# Patient Record
Sex: Male | Born: 1950 | Hispanic: Yes | Marital: Married | Smoking: Light tobacco smoker
Health system: Southern US, Community
[De-identification: ages and names within clinical notes are randomized; demographics above are authoritative.]

## PROBLEM LIST (undated history)

## (undated) DIAGNOSIS — J309 Allergic rhinitis, unspecified: Secondary | ICD-10-CM

## (undated) DIAGNOSIS — K5792 Diverticulitis of intestine, part unspecified, without perforation or abscess without bleeding: Secondary | ICD-10-CM

## (undated) DIAGNOSIS — Z205 Contact with and (suspected) exposure to viral hepatitis: Secondary | ICD-10-CM

## (undated) DIAGNOSIS — K635 Polyp of colon: Secondary | ICD-10-CM

## (undated) DIAGNOSIS — F419 Anxiety disorder, unspecified: Secondary | ICD-10-CM

## (undated) HISTORY — DX: Anxiety disorder, unspecified: F41.9

## (undated) HISTORY — PX: CIRCUMCISION: SUR203

## (undated) HISTORY — DX: Diverticulitis of intestine, part unspecified, without perforation or abscess without bleeding: K57.92

## (undated) HISTORY — DX: Allergic rhinitis, unspecified: J30.9

## (undated) HISTORY — DX: Polyp of colon: K63.5

## (undated) HISTORY — PX: COLONOSCOPY: SHX174

---

## 2012-09-15 ENCOUNTER — Encounter: Payer: Self-pay | Admitting: Gastroenterology

## 2012-09-30 ENCOUNTER — Ambulatory Visit: Payer: Self-pay | Admitting: Gastroenterology

## 2012-10-24 ENCOUNTER — Ambulatory Visit (INDEPENDENT_AMBULATORY_CARE_PROVIDER_SITE_OTHER): Payer: BC Managed Care – PPO | Admitting: Internal Medicine

## 2012-10-24 ENCOUNTER — Encounter: Payer: Self-pay | Admitting: Internal Medicine

## 2012-10-24 VITALS — BP 124/80 | HR 68 | Ht 68.0 in | Wt 156.0 lb

## 2012-10-24 DIAGNOSIS — R1032 Left lower quadrant pain: Secondary | ICD-10-CM

## 2012-10-24 DIAGNOSIS — K5732 Diverticulitis of large intestine without perforation or abscess without bleeding: Secondary | ICD-10-CM

## 2012-10-24 DIAGNOSIS — K625 Hemorrhage of anus and rectum: Secondary | ICD-10-CM

## 2012-10-24 DIAGNOSIS — R933 Abnormal findings on diagnostic imaging of other parts of digestive tract: Secondary | ICD-10-CM

## 2012-10-24 MED ORDER — MOVIPREP 100 G PO SOLR: 1.0000 | Freq: Once | ORAL | Status: DC

## 2012-10-24 NOTE — Patient Instructions (Addendum)
You have been scheduled for a colonoscopy with propofol. Please follow written instructions given to you at your visit today.  Please pick up your prep kit at the pharmacy within the next 1-3 days. If you use inhalers (even only as needed) or a CPAP machine, please bring them with you on the day of your procedure.  

## 2012-10-24 NOTE — Progress Notes (Signed)
HISTORY OF PRESENT ILLNESS:  Samuel Sanford is a 62 y.o. male with no significant past medical history who presents today regarding problems with diverticulitis and the need for colonoscopy. Patient is accompanied by his friend Mrs. Jarold Motto. Patient states that he was in his usual state of good health until 09/04/2012 when, while in [place], he developed problems with lower abdominal pain. He also had episodes of loose bowels and rectal bleeding. CT scan revealed sigmoid diverticulitis without complication. He was hospitalized for several days and placed on antibiotics as well as pain medication. Subsequently discharged with 10 days of oral antibiotic therapy. His pain completely resolved without recurrence. Absolutely no GI complaints at this time. He has not had prior colonoscopy.  REVIEW OF SYSTEMS:  All non-GI ROS negative except for sinus and allergy trouble, anxiety, hearing problems, muscle cramps, shortness of breath  Past Medical History  Diagnosis Date  . Allergic rhinitis   . Diverticulitis   . Hepatitis C     ?  . Anxiety     History reviewed. No pertinent past surgical history.  Social History Marcell Chavarin  reports that he has been smoking Cigarettes.  He has been smoking about 0.00 packs per day for the past 38 years. He has quit using smokeless tobacco. His smokeless tobacco use included Snuff. He reports that  drinks alcohol. He reports that he does not use illicit drugs.  family history includes Heart disease in his mother.  There is no history of Colon cancer.  No Known Allergies     PHYSICAL EXAMINATION: Vital signs: BP 124/80  Pulse 68  Ht 5\' 8"  (1.727 m)  Wt 156 lb (70.761 kg)  BMI 23.73 kg/m2  Constitutional: generally well-appearing, no acute distress Psychiatric: alert and oriented x3, cooperative Eyes: extraocular movements intact, anicteric, conjunctiva pink Mouth: oral pharynx moist, no lesions Neck: supple no lymphadenopathy Cardiovascular:  heart regular rate and rhythm, no murmur Lungs: clear to auscultation bilaterally Abdomen: soft, nontender, nondistended, no obvious ascites, no peritoneal signs, normal bowel sounds, no organomegaly Rectal: Deferred until colonoscopy Extremities: no lower extremity edema bilaterally Skin: Multiple tattoos throughout Neuro: No focal deficits. No asterixis.    ASSESSMENT:  #1. Recent episode of uncomplicated sigmoid diverticulitis. Resolved #2. History of rectal bleeding. Resolved   PLAN:  #1. Colonoscopy.The nature of the procedure, as well as the risks, benefits, and alternatives were carefully and thoroughly reviewed with the patient. Ample time for discussion and questions allowed. The patient understood, was satisfied, and agreed to proceed. Movi prep prescribed. The patient instructed on his use

## 2012-11-03 ENCOUNTER — Encounter: Payer: Self-pay | Admitting: Internal Medicine

## 2012-11-03 ENCOUNTER — Ambulatory Visit (AMBULATORY_SURGERY_CENTER): Payer: BC Managed Care – PPO | Admitting: Internal Medicine

## 2012-11-03 VITALS — BP 126/75 | HR 61 | Temp 97.4°F | Resp 44 | Ht 68.0 in | Wt 156.0 lb

## 2012-11-03 DIAGNOSIS — Z1211 Encounter for screening for malignant neoplasm of colon: Secondary | ICD-10-CM

## 2012-11-03 DIAGNOSIS — R10814 Left lower quadrant abdominal tenderness: Secondary | ICD-10-CM

## 2012-11-03 DIAGNOSIS — K5732 Diverticulitis of large intestine without perforation or abscess without bleeding: Secondary | ICD-10-CM

## 2012-11-03 DIAGNOSIS — R933 Abnormal findings on diagnostic imaging of other parts of digestive tract: Secondary | ICD-10-CM

## 2012-11-03 DIAGNOSIS — D126 Benign neoplasm of colon, unspecified: Secondary | ICD-10-CM

## 2012-11-03 MED ORDER — SODIUM CHLORIDE 0.9 % IV SOLN: 500.0000 mL | INTRAVENOUS | Status: DC

## 2012-11-03 NOTE — Patient Instructions (Addendum)

## 2012-11-03 NOTE — Progress Notes (Signed)
Lidocaine-40mg IV prior to Propofol InductionPropofol given over incremental dosages 

## 2012-11-03 NOTE — Op Note (Signed)
[facility] [address] [place], [address]   COLONOSCOPY PROCEDURE REPORT  PATIENT: Samuel, Sanford  MR#: [id number] BIRTHDATE: July 08, 1951 , 61  yrs. old GENDER: Male ENDOSCOPIST: Roxy Cedar, MD REFERRED XB:[id number] Lenise Arena, M.D. PROCEDURE DATE:  11/03/2012 PROCEDURE:   Colonoscopy with snare polypectomy  x 1 ASA CLASS:   Class II INDICATIONS:Average risk patient for colon cancer and an abnormal CT (diverticulitis). MEDICATIONS: MAC sedation, administered by CRNA and propofol (Diprivan) 250mg  IV  DESCRIPTION OF PROCEDURE:   After the risks benefits and alternatives of the procedure were thoroughly explained, informed consent was obtained.  A digital rectal exam revealed no abnormalities of the rectum.   The LB CF-[id number]  endoscope was introduced through the anus and advanced to the cecum, which was identified by both the appendix and ileocecal valve. No adverse events experienced.   The quality of the prep was excellent, using MoviPrep  The instrument was then slowly withdrawn as the colon was fully examined.      COLON FINDINGS: A diminutive polyp was found in the descending colon.  A polypectomy was performed with a cold snare.  The resection was complete and the polyp tissue was not retrieved (too small - looked like an adenoma).   Moderate diverticulosis was noted in the descending colon and sigmoid colon.   The colon mucosa was otherwise normal.  Retroflexed views revealed no abnormalities. The time to cecum=1 minutes 57 seconds.  Withdrawal time=7 minutes 21 seconds.  The scope was withdrawn and the procedure completed. COMPLICATIONS: There were no complications.  ENDOSCOPIC IMPRESSION: 1.   Diminutive polyp was found in the descending colon; polypectomy was performed with a cold snare 2.   Moderate diverticulosis was noted in the descending colon and sigmoid colon 3.   The colon mucosa was otherwise normal  RECOMMENDATIONS: 1.  Follow up colonoscopy in 5 years   eSigned:  Roxy Cedar, MD 11/03/2012 9:45 AM   cc: Joycelyn Rua, MD and The Patient   PATIENT NAME:  Samuel, Sanford MR#: [id number]

## 2012-11-03 NOTE — Progress Notes (Signed)
Patient did not experience any of the following events: a burn prior to discharge; a fall within the facility; wrong site/side/patient/procedure/implant event; or a hospital transfer or hospital admission upon discharge from the facility. (G8907) Patient did not have preoperative order for IV antibiotic SSI prophylaxis. (G8918)  

## 2012-11-04 ENCOUNTER — Telehealth: Payer: Self-pay | Admitting: *Deleted

## 2012-11-04 NOTE — Telephone Encounter (Signed)
No answer, left message to call if questions or concerns. 

## 2015-05-07 ENCOUNTER — Telehealth: Payer: Self-pay | Admitting: Internal Medicine

## 2015-05-07 ENCOUNTER — Encounter (HOSPITAL_COMMUNITY): Payer: Self-pay | Admitting: *Deleted

## 2015-05-07 ENCOUNTER — Emergency Department (HOSPITAL_COMMUNITY): Payer: BLUE CROSS/BLUE SHIELD

## 2015-05-07 ENCOUNTER — Inpatient Hospital Stay (HOSPITAL_COMMUNITY)
Admission: EM | Admit: 2015-05-07 | Discharge: 2015-05-10 | DRG: 392 | Disposition: A | Discharge status: Home / Self Care | Payer: BLUE CROSS/BLUE SHIELD | Attending: General Surgery | Admitting: General Surgery

## 2015-05-07 DIAGNOSIS — K5720 Diverticulitis of large intestine with perforation and abscess without bleeding: Principal | ICD-10-CM | POA: Diagnosis present

## 2015-05-07 DIAGNOSIS — Z79899 Other long term (current) drug therapy: Secondary | ICD-10-CM

## 2015-05-07 DIAGNOSIS — K5732 Diverticulitis of large intestine without perforation or abscess without bleeding: Secondary | ICD-10-CM | POA: Diagnosis present

## 2015-05-07 DIAGNOSIS — R630 Anorexia: Secondary | ICD-10-CM | POA: Diagnosis present

## 2015-05-07 DIAGNOSIS — B1920 Unspecified viral hepatitis C without hepatic coma: Secondary | ICD-10-CM | POA: Diagnosis present

## 2015-05-07 DIAGNOSIS — Z7901 Long term (current) use of anticoagulants: Secondary | ICD-10-CM

## 2015-05-07 DIAGNOSIS — K760 Fatty (change of) liver, not elsewhere classified: Secondary | ICD-10-CM | POA: Diagnosis present

## 2015-05-07 HISTORY — DX: Contact with and (suspected) exposure to viral hepatitis: Z20.5

## 2015-05-07 LAB — COMPREHENSIVE METABOLIC PANEL
ALK PHOS: 38 U/L (ref 38–126)
ALT: 50 U/L (ref 17–63)
AST: 34 U/L (ref 15–41)
Albumin: 4 g/dL (ref 3.5–5.0)
Anion gap: 7 (ref 5–15)
BILIRUBIN TOTAL: 1.2 mg/dL (ref 0.3–1.2)
BUN: 15 mg/dL (ref 6–20)
CALCIUM: 9.1 mg/dL (ref 8.9–10.3)
CHLORIDE: 101 mmol/L (ref 101–111)
CO2: 27 mmol/L (ref 22–32)
CREATININE: 0.89 mg/dL (ref 0.61–1.24)
Glucose, Bld: 102 mg/dL — ABNORMAL HIGH (ref 65–99)
Potassium: 3.5 mmol/L (ref 3.5–5.1)
Sodium: 135 mmol/L (ref 135–145)
TOTAL PROTEIN: 7.2 g/dL (ref 6.5–8.1)

## 2015-05-07 LAB — URINALYSIS, ROUTINE W REFLEX MICROSCOPIC
BILIRUBIN URINE: NEGATIVE
Glucose, UA: NEGATIVE mg/dL
Hgb urine dipstick: NEGATIVE
KETONES UR: NEGATIVE mg/dL
NITRITE: NEGATIVE
PH: 6 (ref 5.0–8.0)
PROTEIN: NEGATIVE mg/dL
Specific Gravity, Urine: 1.021 (ref 1.005–1.030)
Urobilinogen, UA: 0.2 mg/dL (ref 0.0–1.0)

## 2015-05-07 LAB — CBC WITH DIFFERENTIAL/PLATELET
BASOS ABS: 0 10*3/uL (ref 0.0–0.1)
BASOS PCT: 0 % (ref 0–1)
EOS ABS: 0 10*3/uL (ref 0.0–0.7)
EOS PCT: 0 % (ref 0–5)
HCT: 43.8 % (ref 39.0–52.0)
Hemoglobin: 14.7 g/dL (ref 13.0–17.0)
LYMPHS PCT: 13 % (ref 12–46)
Lymphs Abs: 1.3 10*3/uL (ref 0.7–4.0)
MCH: 28.7 pg (ref 26.0–34.0)
MCHC: 33.6 g/dL (ref 30.0–36.0)
MCV: 85.4 fL (ref 78.0–100.0)
MONO ABS: 0.7 10*3/uL (ref 0.1–1.0)
Monocytes Relative: 7 % (ref 3–12)
Neutro Abs: 8.2 10*3/uL — ABNORMAL HIGH (ref 1.7–7.7)
Neutrophils Relative %: 80 % — ABNORMAL HIGH (ref 43–77)
PLATELETS: 146 10*3/uL — AB (ref 150–400)
RBC: 5.13 MIL/uL (ref 4.22–5.81)
RDW: 13.8 % (ref 11.5–15.5)
WBC: 10.2 10*3/uL (ref 4.0–10.5)

## 2015-05-07 LAB — URINE MICROSCOPIC-ADD ON

## 2015-05-07 LAB — LIPASE, BLOOD: LIPASE: 19 U/L — AB (ref 22–51)

## 2015-05-07 MED ORDER — FENTANYL CITRATE (PF) 100 MCG/2ML IJ SOLN
100.0000 ug | INTRAMUSCULAR | Status: DC | PRN
  Administered 2015-05-07: 100 ug via INTRAVENOUS
  Filled 2015-05-07: qty 2

## 2015-05-07 MED ORDER — DOCUSATE SODIUM 100 MG PO CAPS: 100.0000 mg | ORAL_CAPSULE | Freq: Two times a day (BID) | ORAL | Status: DC | PRN

## 2015-05-07 MED ORDER — ONDANSETRON HCL 4 MG/2ML IJ SOLN
4.0000 mg | Freq: Once | INTRAMUSCULAR | Status: AC
  Administered 2015-05-07: 4 mg via INTRAVENOUS
  Filled 2015-05-07: qty 2

## 2015-05-07 MED ORDER — CIPROFLOXACIN IN D5W 400 MG/200ML IV SOLN
400.0000 mg | Freq: Once | INTRAVENOUS | Status: AC
  Administered 2015-05-07: 400 mg via INTRAVENOUS
  Filled 2015-05-07: qty 200

## 2015-05-07 MED ORDER — ACETAMINOPHEN 650 MG RE SUPP: 650.0000 mg | Freq: Four times a day (QID) | RECTAL | Status: DC | PRN

## 2015-05-07 MED ORDER — HYDROCODONE-ACETAMINOPHEN 5-325 MG PO TABS: 1.0000 | ORAL_TABLET | ORAL | Status: DC | PRN

## 2015-05-07 MED ORDER — METHOCARBAMOL 500 MG PO TABS: 500.0000 mg | ORAL_TABLET | Freq: Four times a day (QID) | ORAL | Status: DC | PRN

## 2015-05-07 MED ORDER — PIPERACILLIN-TAZOBACTAM 3.375 G IVPB
3.3750 g | Freq: Three times a day (TID) | INTRAVENOUS | Status: DC
  Filled 2015-05-07: qty 50

## 2015-05-07 MED ORDER — ACETAMINOPHEN 325 MG PO TABS: 650.0000 mg | ORAL_TABLET | Freq: Four times a day (QID) | ORAL | Status: DC | PRN

## 2015-05-07 MED ORDER — HYDRALAZINE HCL 20 MG/ML IJ SOLN: 10.0000 mg | INTRAMUSCULAR | Status: DC | PRN

## 2015-05-07 MED ORDER — ONDANSETRON 4 MG PO TBDP: 4.0000 mg | ORAL_TABLET | Freq: Four times a day (QID) | ORAL | Status: DC | PRN

## 2015-05-07 MED ORDER — METRONIDAZOLE IN NACL 5-0.79 MG/ML-% IV SOLN
500.0000 mg | Freq: Once | INTRAVENOUS | Status: AC
  Administered 2015-05-07: 500 mg via INTRAVENOUS
  Filled 2015-05-07: qty 100

## 2015-05-07 MED ORDER — ENOXAPARIN SODIUM 40 MG/0.4ML ~~LOC~~ SOLN
40.0000 mg | SUBCUTANEOUS | Status: DC
  Administered 2015-05-07 – 2015-05-09 (×3): 40 mg via SUBCUTANEOUS
  Filled 2015-05-07 (×4): qty 0.4

## 2015-05-07 MED ORDER — DIPHENHYDRAMINE HCL 12.5 MG/5ML PO ELIX: 12.5000 mg | ORAL_SOLUTION | Freq: Four times a day (QID) | ORAL | Status: DC | PRN

## 2015-05-07 MED ORDER — MORPHINE SULFATE (PF) 2 MG/ML IV SOLN
1.0000 mg | INTRAVENOUS | Status: DC | PRN
  Administered 2015-05-07: 2 mg via INTRAVENOUS
  Administered 2015-05-07 – 2015-05-08 (×9): 4 mg via INTRAVENOUS
  Administered 2015-05-08: 2 mg via INTRAVENOUS
  Administered 2015-05-09: 4 mg via INTRAVENOUS
  Filled 2015-05-07 (×5): qty 2
  Filled 2015-05-07: qty 1
  Filled 2015-05-07 (×6): qty 2

## 2015-05-07 MED ORDER — IOHEXOL 300 MG/ML  SOLN
100.0000 mL | Freq: Once | INTRAMUSCULAR | Status: AC | PRN
  Administered 2015-05-07: 100 mL via INTRAVENOUS

## 2015-05-07 MED ORDER — SIMETHICONE 80 MG PO CHEW
40.0000 mg | CHEWABLE_TABLET | Freq: Four times a day (QID) | ORAL | Status: DC | PRN
  Filled 2015-05-07: qty 1

## 2015-05-07 MED ORDER — IOHEXOL 300 MG/ML  SOLN
50.0000 mL | Freq: Once | INTRAMUSCULAR | Status: AC | PRN
  Administered 2015-05-07: 50 mL via ORAL

## 2015-05-07 MED ORDER — DIPHENHYDRAMINE HCL 50 MG/ML IJ SOLN: 12.5000 mg | Freq: Four times a day (QID) | INTRAMUSCULAR | Status: DC | PRN

## 2015-05-07 MED ORDER — ONDANSETRON HCL 4 MG/2ML IJ SOLN: 4.0000 mg | Freq: Four times a day (QID) | INTRAMUSCULAR | Status: DC | PRN

## 2015-05-07 MED ORDER — POTASSIUM CHLORIDE IN NACL 20-0.9 MEQ/L-% IV SOLN
INTRAVENOUS | Status: DC
Start: 2015-05-07 — End: 2015-05-10
  Administered 2015-05-07 – 2015-05-10 (×7): via INTRAVENOUS
  Filled 2015-05-07 (×9): qty 1000

## 2015-05-07 MED ORDER — SODIUM CHLORIDE 0.9 % IV BOLUS (SEPSIS)
1000.0000 mL | Freq: Once | INTRAVENOUS | Status: AC
  Administered 2015-05-07: 1000 mL via INTRAVENOUS

## 2015-05-07 MED ORDER — PIPERACILLIN-TAZOBACTAM 3.375 G IVPB
3.3750 g | Freq: Three times a day (TID) | INTRAVENOUS | Status: DC
  Administered 2015-05-07 – 2015-05-10 (×8): 3.375 g via INTRAVENOUS
  Filled 2015-05-07 (×9): qty 50

## 2015-05-07 MED ORDER — SODIUM CHLORIDE 0.9 % IV SOLN
INTRAVENOUS | Status: DC
  Administered 2015-05-07: 12:00:00 via INTRAVENOUS

## 2015-05-07 NOTE — H&P (Signed)
[facility] Admission Note  Samuel Sanford 09-Sep-1950  [id number].    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  He says it feels just like his diverticulitis flair 3 years ago.  He was admitted for several days for that episode in [place] and medically treated with good result.  He has not had any problems until the last few days.  He also c/o diarrhea with a small amount of blood on the stools.  He's never had any abdominal problems and rarely gets sick.  He takes care of his disabled wife of 21 years.  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    ROS: All systems reviewed and otherwise negative except for as above  Family History  Problem Relation Age of Onset  . Colon cancer Neg Hx   . Heart disease Mother     Past Medical History  Diagnosis Date  . Allergic rhinitis   . Diverticulitis   . Hepatitis C     ?  . Anxiety     No past surgical history on file.  Social History:  reports that he quit smoking about 20 years ago. His smoking use included Cigars. He has quit using smokeless tobacco. His smokeless tobacco use included Snuff. He reports that he drinks alcohol. He reports that he does not use illicit drugs.  Allergies: No Known Allergies   (Not in a hospital admission)  Blood pressure 159/72, pulse 68, temperature 98.3 F (36.8 C), temperature source Oral, resp. rate 18, height 5' 8"  (1.727 m), weight 74.844 kg (165 lb), SpO2 98 %. Physical Exam: General: pleasant, WD/WN Hispanic male who is laying in bed in NAD HEENT: head is normocephalic, atraumatic.  Sclera are noninjected.  PERRL.  Ears and nose without any masses or lesions.  Mouth is pink and  moist Heart: regular, rate, and rhythm.  No obvious murmurs, gallops, or rubs noted.  Palpable pedal pulses bilaterally Lungs: CTAB, no wheezes, rhonchi, or rales noted.  Respiratory effort nonlabored Abd: soft, mildly distended, diffusely tender with exquisite tenderness in the LLQ/suprapubic region, +BS, no masses, hernias, or organomegaly MS: all 4 extremities are symmetrical with no cyanosis, clubbing, or edema. Skin: Tattoo sleeves to arms/legs, chest/abdominal tattoos.  Warm and dry with no masses, lesions, or rashes Psych: A&Ox3 with an appropriate affect.   Results for orders placed or performed during the hospital encounter of 05/07/15 (from the past 48 hour(s))  Comprehensive metabolic panel     Status: Abnormal   Collection Time: 05/07/15 10:54 AM  Result Value Ref Range   Sodium 135 135 - 145 mmol/L   Potassium 3.5 3.5 - 5.1 mmol/L   Chloride 101 101 - 111 mmol/L   CO2 27 22 - 32 mmol/L   Glucose, Bld 102 (H) 65 - 99 mg/dL   BUN 15 6 - 20 mg/dL   Creatinine, Ser 0.89 0.61 - 1.24 mg/dL   Calcium 9.1 8.9 - 10.3 mg/dL   Total Protein 7.2 6.5 - 8.1 g/dL   Albumin 4.0 3.5 - 5.0 g/dL   AST 34 15 - 41 U/L   ALT 50 17 - 63 U/L   Alkaline Phosphatase 38 38 - 126 U/L  Total Bilirubin 1.2 0.3 - 1.2 mg/dL   GFR calc non Af Amer >60 >60 mL/min   GFR calc Af Amer >60 >60 mL/min    Comment: (NOTE) The eGFR has been calculated using the CKD EPI equation. This calculation has not been validated in all clinical situations. eGFR's persistently <60 mL/min signify possible Chronic Kidney Disease.    Anion gap 7 5 - 15  Lipase, blood     Status: Abnormal   Collection Time: 05/07/15 10:54 AM  Result Value Ref Range   Lipase 19 (L) 22 - 51 U/L  CBC with Differential     Status: Abnormal   Collection Time: 05/07/15 10:54 AM  Result Value Ref Range   WBC 10.2 4.0 - 10.5 K/uL   RBC 5.13 4.22 - 5.81 MIL/uL   Hemoglobin 14.7 13.0 - 17.0 g/dL   HCT 43.8 39.0 - 52.0 %   MCV 85.4 78.0 -  100.0 fL   MCH 28.7 26.0 - 34.0 pg   MCHC 33.6 30.0 - 36.0 g/dL   RDW 13.8 11.5 - 15.5 %   Platelets 146 (L) 150 - 400 K/uL   Neutrophils Relative % 80 (H) 43 - 77 %   Neutro Abs 8.2 (H) 1.7 - 7.7 K/uL   Lymphocytes Relative 13 12 - 46 %   Lymphs Abs 1.3 0.7 - 4.0 K/uL   Monocytes Relative 7 3 - 12 %   Monocytes Absolute 0.7 0.1 - 1.0 K/uL   Eosinophils Relative 0 0 - 5 %   Eosinophils Absolute 0.0 0.0 - 0.7 K/uL   Basophils Relative 0 0 - 1 %   Basophils Absolute 0.0 0.0 - 0.1 K/uL  Urinalysis, Routine w reflex microscopic     Status: Abnormal   Collection Time: 05/07/15 11:29 AM  Result Value Ref Range   Color, Urine AMBER (A) YELLOW    Comment: BIOCHEMICALS MAY BE AFFECTED BY COLOR   APPearance CLEAR CLEAR   Specific Gravity, Urine 1.021 1.005 - 1.030   pH 6.0 5.0 - 8.0   Glucose, UA NEGATIVE NEGATIVE mg/dL   Hgb urine dipstick NEGATIVE NEGATIVE   Bilirubin Urine NEGATIVE NEGATIVE   Ketones, ur NEGATIVE NEGATIVE mg/dL   Protein, ur NEGATIVE NEGATIVE mg/dL   Urobilinogen, UA 0.2 0.0 - 1.0 mg/dL   Nitrite NEGATIVE NEGATIVE   Leukocytes, UA TRACE (A) NEGATIVE  Urine microscopic-add on     Status: Abnormal   Collection Time: 05/07/15 11:29 AM  Result Value Ref Range   Squamous Epithelial / LPF RARE RARE   WBC, UA 0-2 <3 WBC/hpf   Bacteria, UA FEW (A) RARE   Casts HYALINE CASTS (A) NEGATIVE   Urine-Other MUCOUS PRESENT    Ct Abdomen Pelvis W Contrast  05/07/2015   CLINICAL DATA:  Abdominal pain, rectal bleeding, history of diverticulitis  EXAM: CT ABDOMEN AND PELVIS WITH CONTRAST  TECHNIQUE: Multidetector CT imaging of the abdomen and pelvis was performed using the standard protocol following bolus administration of intravenous contrast.  CONTRAST:  48m OMNIPAQUE IOHEXOL 300 MG/ML SOLN, 1078mOMNIPAQUE IOHEXOL 300 MG/ML SOLN  COMPARISON:  None.  FINDINGS: Lung bases are unremarkable. Mild fatty infiltration of the liver. No focal hepatic mass. No calcified gallstones are noted  within gallbladder. No aortic aneurysm. The pancreas, spleen and adrenal glands are unremarkable. Kidneys are symmetrical in size and enhancement. No hydronephrosis or hydroureter. Delayed renal images shows bilateral renal symmetrical excretion. Bilateral visualized proximal ureter is unremarkable.  No small bowel obstruction. No adenopathy. Normal  appendix is noted in axial image 54. There is no pericecal inflammation.  Few diverticula are noted in descending colon. In axial image 52 days inflammatory process in left lower quadrant at the junction of the descending colon with sigmoid colon. There is stranding of pericolonic fat. There is extraluminal air and oral contrast material as seen in axial image 56. Findings are consistent with perforated acute diverticulitis. There is no definite evidence of abscess. Additional extraluminal gas is noted within left lower quadrant axial image 45 within left mesentery.  Prostate gland and seminal vesicles are unremarkable. There is no distal colonic obstruction. Urinary bladder is unremarkable.  IMPRESSION: 1. Findings consistent with perforated acute diverticulitis left lower quadrant of the abdomen as described above. There is extraluminal air and contrast material best seen in axial image 56. The site of perforation is noted in left lower quadrant axial image 52. 2. Normal appendix.  No pericecal inflammation. 3. No small bowel or colonic obstruction. 4. No hydronephrosis or hydroureter. These results were called by telephone at the time of interpretation on 05/07/2015 at 1:12 pm to Dr. Daleen Bo , who verbally acknowledged these results.   Electronically Signed   By: Lahoma Crocker M.D.   On: 05/07/2015 13:14      Assessment/Plan Contained perforated diverticulitis (second episode) -Admit to [facility] -NPO, bowel rest, IVF, pain control, antiemetics, antibiotics (Zosyn Day #1) -SCD's and lovenox for DVT proph -Ambulate and IS -Will treat medically for now, hopefully  he will not require emergent surgery while here and he will improve as he did 3 years ago.  If not improve or worsens over the next 48 hours may need ex lap, partial colectomy and colostomy.   -Follow labs in am   Nat Christen, [facility] 05/07/2015, 2:36 PM Pager: [phone]

## 2015-05-07 NOTE — ED Notes (Signed)
Called [phone], spoke with Raquel Sarna.  Began giving report & was informed room assignment had changed.  Pt's room assignment now 1323, called [phone], spoke with Ebony Hail.  Was informed need 10 minutes d/t room has not been assigned presently.

## 2015-05-07 NOTE — ED Notes (Signed)
Per [facility], pt has hx of diverticulitis, was tx'd x 3 years ago for same.  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  Abd pain since Sunday, was seen @ a UC, given flagyl & cipro, has taken 4 doses.  Also went to [facility], had blood drawn, and sent to the [facility]; pt left.  IV access initiated by EMS, left hand, 18 g, given fentanyl 50 mcg x 2.  Pt stated "last BM was this morning. My belly is still sore, I don't have the excruciating pain now since she gave me the medicine."

## 2015-05-07 NOTE — ED Provider Notes (Signed)
CSN: [id number]     Arrival date & time 05/07/15  1001 History   First MD Initiated Contact with Patient 05/07/15 1024     Chief Complaint  Patient presents with  . Abdominal Pain     (Consider location/radiation/quality/duration/timing/severity/associated sxs/prior Treatment) HPI  Samuel Sanford is a 64 y.o. male who is here for evaluation of abdominal pain present for 3 years similar to prior episode of diverticulitis several years ago. He was started on oral antibody, 2 days ago, but has had no relief. This morning he had a bowel movement, brown in color, loose, containing blood. No vomiting, fever, chills, cough, shortness of breath, chest pain, weakness or dizziness. There are no other known modifying factors.     Past Medical History  Diagnosis Date  . Allergic rhinitis   . Diverticulitis   . Hepatitis C     ?  . Anxiety    No past surgical history on file. Family History  Problem Relation Age of Onset  . Colon cancer Neg Hx   . Heart disease Mother    Social History  Substance Use Topics  . Smoking status: Former Smoker -- 38 years    Types: Cigars    Quit date: 08/31/1994  . Smokeless tobacco: Former Systems developer    Types: Snuff  . Alcohol Use: Yes     Comment: occasionlly    Review of Systems  All other systems reviewed and are negative.     Allergies  Review of patient's allergies indicates no known allergies.  Home Medications   Prior to Admission medications   Medication Sig Start Date End Date Taking? Authorizing Provider  ciprofloxacin (CIPRO) 500 MG tablet Take 1 tablet by mouth daily. 05/06/15  Yes Historical Provider, MD  metroNIDAZOLE (FLAGYL) 500 MG tablet Take 1 tablet by mouth 2 (two) times daily. 05/06/15  Yes Historical Provider, MD  naproxen sodium (ANAPROX) 220 MG tablet Take 220 mg by mouth as needed (pain).    Yes Historical Provider, MD  sildenafil (VIAGRA) 100 MG tablet Take 100 mg by mouth daily as needed for erectile dysfunction.   Yes  Historical Provider, MD   BP 127/82 mmHg  Pulse 64  Temp(Src) 98.4 F (36.9 C) (Oral)  Resp 16  Ht 5\' 8"  (1.727 m)  Wt 165 lb (74.844 kg)  BMI 25.09 kg/m2  SpO2 98% Physical Exam  Constitutional: He is oriented to person, place, and time. He appears well-developed and well-nourished.  HENT:  Head: Normocephalic and atraumatic.  Right Ear: External ear normal.  Left Ear: External ear normal.  Eyes: Conjunctivae and EOM are normal. Pupils are equal, round, and reactive to light.  Neck: Normal range of motion and phonation normal. Neck supple.  Cardiovascular: Normal rate, regular rhythm and normal heart sounds.   Pulmonary/Chest: Effort normal and breath sounds normal. He exhibits no bony tenderness.  Abdominal: Soft. He exhibits no mass. There is tenderness (moderate  left upper and lower quadrant tenderness). There is no rebound and no guarding.  Musculoskeletal: Normal range of motion.  Neurological: He is alert and oriented to person, place, and time. No cranial nerve deficit or sensory deficit. He exhibits normal muscle tone. Coordination normal.  Skin: Skin is warm, dry and intact.  Psychiatric: He has a normal mood and affect. His behavior is normal. Judgment and thought content normal.  Nursing note and vitals reviewed.   ED Course  Procedures (including critical care time)  Medications  0.9 %  sodium chloride infusion ( Intravenous  New Bag/Given 05/07/15 1225)  fentaNYL (SUBLIMAZE) injection 100 mcg (100 mcg Intravenous Given 05/07/15 1119)  metroNIDAZOLE (FLAGYL) IVPB 500 mg (500 mg Intravenous New Bag/Given 05/07/15 1224)  sodium chloride 0.9 % bolus 1,000 mL (0 mLs Intravenous Stopped 05/07/15 1225)  ondansetron (ZOFRAN) injection 4 mg (4 mg Intravenous Given 05/07/15 1225)  ciprofloxacin (CIPRO) IVPB 400 mg (0 mg Intravenous Stopped 05/07/15 1225)  iohexol (OMNIPAQUE) 300 MG/ML solution 50 mL (50 mLs Oral Contrast Given 05/07/15 1110)  iohexol (OMNIPAQUE) 300 MG/ML solution 100  mL (100 mLs Intravenous Contrast Given 05/07/15 1243)    Patient Vitals for the past 24 hrs:  BP Temp Temp src Pulse Resp SpO2 Height Weight  05/07/15 1027 127/82 mmHg 98.4 F (36.9 C) Oral 64 16 98 % 5\' 8"  (1.727 m) 165 lb (74.844 kg)   13:20- Consult Surgery to eval. and admit- they agree to see the patient in the ED  1:27 PM Reevaluation with update and discussion. After initial assessment and treatment, an updated evaluation reveals he reports some improvement of pain from 10-7/10 at this time. Findings discussed with the patient. He will be kept nothing by mouth for possible surgical intervention. Indira Sorenson L    Labs Review Labs Reviewed  COMPREHENSIVE METABOLIC PANEL - Abnormal; Notable for the following:    Glucose, Bld 102 (*)    All other components within normal limits  LIPASE, BLOOD - Abnormal; Notable for the following:    Lipase 19 (*)    All other components within normal limits  CBC WITH DIFFERENTIAL/PLATELET - Abnormal; Notable for the following:    Platelets 146 (*)    Neutrophils Relative % 80 (*)    Neutro Abs 8.2 (*)    All other components within normal limits  URINALYSIS, ROUTINE W REFLEX MICROSCOPIC (NOT AT [facility]) - Abnormal; Notable for the following:    Color, Urine AMBER (*)    Leukocytes, UA TRACE (*)    All other components within normal limits  URINE MICROSCOPIC-ADD ON - Abnormal; Notable for the following:    Bacteria, UA FEW (*)    Casts HYALINE CASTS (*)    All other components within normal limits  URINE CULTURE    Imaging Review Ct Abdomen Pelvis W Contrast  05/07/2015   CLINICAL DATA:  Abdominal pain, rectal bleeding, history of diverticulitis  EXAM: CT ABDOMEN AND PELVIS WITH CONTRAST  TECHNIQUE: Multidetector CT imaging of the abdomen and pelvis was performed using the standard protocol following bolus administration of intravenous contrast.  CONTRAST:  57mL OMNIPAQUE IOHEXOL 300 MG/ML SOLN, 159mL OMNIPAQUE IOHEXOL 300 MG/ML SOLN   COMPARISON:  None.  FINDINGS: Lung bases are unremarkable. Mild fatty infiltration of the liver. No focal hepatic mass. No calcified gallstones are noted within gallbladder. No aortic aneurysm. The pancreas, spleen and adrenal glands are unremarkable. Kidneys are symmetrical in size and enhancement. No hydronephrosis or hydroureter. Delayed renal images shows bilateral renal symmetrical excretion. Bilateral visualized proximal ureter is unremarkable.  No small bowel obstruction. No adenopathy. Normal appendix is noted in axial image 54. There is no pericecal inflammation.  Few diverticula are noted in descending colon. In axial image 52 days inflammatory process in left lower quadrant at the junction of the descending colon with sigmoid colon. There is stranding of pericolonic fat. There is extraluminal air and oral contrast material as seen in axial image 56. Findings are consistent with perforated acute diverticulitis. There is no definite evidence of abscess. Additional extraluminal gas is noted within left lower quadrant  axial image 45 within left mesentery.  Prostate gland and seminal vesicles are unremarkable. There is no distal colonic obstruction. Urinary bladder is unremarkable.  IMPRESSION: 1. Findings consistent with perforated acute diverticulitis left lower quadrant of the abdomen as described above. There is extraluminal air and contrast material best seen in axial image 56. The site of perforation is noted in left lower quadrant axial image 52. 2. Normal appendix.  No pericecal inflammation. 3. No small bowel or colonic obstruction. 4. No hydronephrosis or hydroureter. These results were called by telephone at the time of interpretation on 05/07/2015 at 1:12 pm to Dr. Daleen Bo , who verbally acknowledged these results.   Electronically Signed   By: Lahoma Crocker M.D.   On: 05/07/2015 13:14   I have personally reviewed and evaluated these images and lab results as part of my medical  decision-making.   EKG Interpretation None      MDM   Final diagnoses:  Diverticulitis of colon with perforation    Diverticulitis, with perforation. No abscess visualized at this time. No signs of severe sepsis, metabolic instability or suggestion for any vascular collapse. He will need to be admitted for further evaluation and treatment.  Nursing Notes Reviewed/ Care Coordinated, and agree without changes. Applicable Imaging Reviewed.  Interpretation of Laboratory Data incorporated into ED treatment  Plan: Admit   Daleen Bo, MD 05/07/15 [phone]

## 2015-05-07 NOTE — ED Notes (Signed)
Patient transported to CT 

## 2015-05-07 NOTE — Telephone Encounter (Signed)
Left number to call back, unable to leave voicemail.

## 2015-05-07 NOTE — ED Notes (Signed)
Pt belongings bagged:  Sandals, khaki short, a red & a navy shirt, leather belt.  Pt stated spouse took his wallet home.

## 2015-05-07 NOTE — ED Notes (Signed)
Bed: BZ16 Expected date:  Expected time:  Means of arrival:  Comments: Ems- abd pain

## 2015-05-08 DIAGNOSIS — K5720 Diverticulitis of large intestine with perforation and abscess without bleeding: Secondary | ICD-10-CM | POA: Diagnosis present

## 2015-05-08 DIAGNOSIS — B1920 Unspecified viral hepatitis C without hepatic coma: Secondary | ICD-10-CM | POA: Diagnosis present

## 2015-05-08 DIAGNOSIS — Z7901 Long term (current) use of anticoagulants: Secondary | ICD-10-CM | POA: Diagnosis not present

## 2015-05-08 DIAGNOSIS — Z79899 Other long term (current) drug therapy: Secondary | ICD-10-CM | POA: Diagnosis not present

## 2015-05-08 DIAGNOSIS — R630 Anorexia: Secondary | ICD-10-CM | POA: Diagnosis present

## 2015-05-08 DIAGNOSIS — K760 Fatty (change of) liver, not elsewhere classified: Secondary | ICD-10-CM | POA: Diagnosis present

## 2015-05-08 LAB — BASIC METABOLIC PANEL
ANION GAP: 7 (ref 5–15)
BUN: 10 mg/dL (ref 6–20)
CO2: 24 mmol/L (ref 22–32)
Calcium: 8.4 mg/dL — ABNORMAL LOW (ref 8.9–10.3)
Chloride: 105 mmol/L (ref 101–111)
Creatinine, Ser: 0.9 mg/dL (ref 0.61–1.24)
GFR calc non Af Amer: >60 mL/min (ref >60)
GLUCOSE: 91 mg/dL (ref 65–99)
POTASSIUM: 3.9 mmol/L (ref 3.5–5.1)
Sodium: 136 mmol/L (ref 135–145)

## 2015-05-08 LAB — URINE CULTURE
CULTURE: NO GROWTH
SPECIAL REQUESTS: NORMAL

## 2015-05-08 LAB — CBC
HEMATOCRIT: 42.1 % (ref 39.0–52.0)
HEMOGLOBIN: 14.1 g/dL (ref 13.0–17.0)
MCH: 29 pg (ref 26.0–34.0)
MCHC: 33.5 g/dL (ref 30.0–36.0)
MCV: 86.6 fL (ref 78.0–100.0)
Platelets: 139 10*3/uL — ABNORMAL LOW (ref 150–400)
RBC: 4.86 MIL/uL (ref 4.22–5.81)
RDW: 13.9 % (ref 11.5–15.5)
WBC: 9.8 10*3/uL (ref 4.0–10.5)

## 2015-05-08 NOTE — Progress Notes (Signed)
Subjective: He feels much better this AM  Still a little tenderness LLQ, but much improved.  Objective: Vital signs in last 24 hours: Temp:  [98 F (36.7 C)-99.1 F (37.3 C)] 98.3 F (36.8 C) (09/07 0542) Pulse Rate:  [64-78] 71 (09/07 0542) Resp:  [16-18] 16 (09/07 0542) BP: (127-159)/(70-83) 129/74 mmHg (09/07 0542) SpO2:  [97 %-99 %] 98 % (09/07 0542) Weight:  [74.526 kg (164 lb 4.8 oz)-74.844 kg (165 lb)] 74.526 kg (164 lb 4.8 oz) (09/06 1629) Last BM Date: 05/07/15 NPO 600 urine TM 99.1 Labs are all normal Intake/Output from previous day: 09/06 0701 - 09/07 0700 In: -  Out: 600 [Urine:600] Intake/Output this shift:    General appearance: alert, cooperative and no distress GI: soft, still a bit tender LLQ, BS hyperacitve, no peritionitis. + BM, no distension.  Lab Results:   Recent Labs  05/07/15 1054 05/08/15 0433  WBC 10.2 9.8  HGB 14.7 14.1  HCT 43.8 42.1  PLT 146* 139*    BMET  Recent Labs  05/07/15 1054 05/08/15 0433  NA 135 136  K 3.5 3.9  CL 101 105  CO2 27 24  GLUCOSE 102* 91  BUN 15 10  CREATININE 0.89 0.90  CALCIUM 9.1 8.4*   PT/INR No results for input(s): LABPROT, INR in the last 72 hours.   Recent Labs Lab 05/07/15 1054  AST 34  ALT 50  ALKPHOS 38  BILITOT 1.2  PROT 7.2  ALBUMIN 4.0     Lipase     Component Value Date/Time   LIPASE 19* 05/07/2015 1054     Studies/Results: Ct Abdomen Pelvis W Contrast  05/07/2015   CLINICAL DATA:  Abdominal pain, rectal bleeding, history of diverticulitis  EXAM: CT ABDOMEN AND PELVIS WITH CONTRAST  TECHNIQUE: Multidetector CT imaging of the abdomen and pelvis was performed using the standard protocol following bolus administration of intravenous contrast.  CONTRAST:  25mL OMNIPAQUE IOHEXOL 300 MG/ML SOLN, 172mL OMNIPAQUE IOHEXOL 300 MG/ML SOLN  COMPARISON:  None.  FINDINGS: Lung bases are unremarkable. Mild fatty infiltration of the liver. No focal hepatic mass. No calcified gallstones  are noted within gallbladder. No aortic aneurysm. The pancreas, spleen and adrenal glands are unremarkable. Kidneys are symmetrical in size and enhancement. No hydronephrosis or hydroureter. Delayed renal images shows bilateral renal symmetrical excretion. Bilateral visualized proximal ureter is unremarkable.  No small bowel obstruction. No adenopathy. Normal appendix is noted in axial image 54. There is no pericecal inflammation.  Few diverticula are noted in descending colon. In axial image 52 days inflammatory process in left lower quadrant at the junction of the descending colon with sigmoid colon. There is stranding of pericolonic fat. There is extraluminal air and oral contrast material as seen in axial image 56. Findings are consistent with perforated acute diverticulitis. There is no definite evidence of abscess. Additional extraluminal gas is noted within left lower quadrant axial image 45 within left mesentery.  Prostate gland and seminal vesicles are unremarkable. There is no distal colonic obstruction. Urinary bladder is unremarkable.  IMPRESSION: 1. Findings consistent with perforated acute diverticulitis left lower quadrant of the abdomen as described above. There is extraluminal air and contrast material best seen in axial image 56. The site of perforation is noted in left lower quadrant axial image 52. 2. Normal appendix.  No pericecal inflammation. 3. No small bowel or colonic obstruction. 4. No hydronephrosis or hydroureter. These results were called by telephone at the time of interpretation on 05/07/2015 at 1:12 pm to  Dr. Daleen Bo , who verbally acknowledged these results.   Electronically Signed   By: Lahoma Crocker M.D.   On: 05/07/2015 13:14    Medications: . enoxaparin (LOVENOX) injection  40 mg Subcutaneous Q24H  . piperacillin-tazobactam (ZOSYN)  IV  3.375 g Intravenous Q8H   . 0.9 % NaCl with KCl 20 mEq / L 100 mL/hr at 05/08/15 0150   Prior to Admission medications   Medication  Sig Start Date End Date Taking? Authorizing Provider  ciprofloxacin (CIPRO) 500 MG tablet Take 1 tablet by mouth daily. 05/06/15  Yes Historical Provider, MD  metroNIDAZOLE (FLAGYL) 500 MG tablet Take 1 tablet by mouth 2 (two) times daily. 05/06/15  Yes Historical Provider, MD  naproxen sodium (ANAPROX) 220 MG tablet Take 220 mg by mouth as needed (pain).    Yes Historical Provider, MD  sildenafil (VIAGRA) 100 MG tablet Take 100 mg by mouth daily as needed for erectile dysfunction.   Yes Historical Provider, MD     Assessment/Plan Diverticulitis with microperforation (second episode) Antibiotics:  Day 2 Zosyn DVT:  Lovenox/SCD    Plan:  Continue antibiotics, ice chips, I will check on starting clears later today.  He is much better.      Samuel Sanford 05/08/2015

## 2015-05-08 NOTE — Care Management Note (Signed)
Case Management Note  Patient Details  Name: Tejay Hubert MRN: [id number] Date of Birth: 1951-08-30  Subjective/Objective:   64 y/o m admitted w/diverticulitis. From home.                 Action/Plan:d/c plan home.   Expected Discharge Date:   (UNKNOWN)               Expected Discharge Plan:  Home/Self Care  In-House Referral:     Discharge planning Services  CM Consult  Post Acute Care Choice:    Choice offered to:     DME Arranged:    DME Agency:     HH Arranged:    HH Agency:     Status of Service:  In process, will continue to follow  Medicare Important Message Given:    Date Medicare IM Given:    Medicare IM give by:    Date Additional Medicare IM Given:    Additional Medicare Important Message give by:     If discussed at [place] of Stay Meetings, dates discussed:    Additional Comments:  Dessa Phi, RN 05/08/2015, 3:12 PM

## 2015-05-09 MED ORDER — SACCHAROMYCES BOULARDII 250 MG PO CAPS
250.0000 mg | ORAL_CAPSULE | Freq: Two times a day (BID) | ORAL | Status: DC
  Administered 2015-05-09 – 2015-05-10 (×3): 250 mg via ORAL
  Filled 2015-05-09 (×3): qty 1

## 2015-05-09 NOTE — Progress Notes (Signed)
Subjective: He continues to improve, he had some blood in his stool yesterday, but had 2 more BM's that were without blood later.  He has no pain this AM.  Objective: Vital signs in last 24 hours: Temp:  [97.5 F (36.4 C)-98 F (36.7 C)] 97.5 F (36.4 C) (09/08 0636) Pulse Rate:  [64-68] 66 (09/08 0636) Resp:  [18] 18 (09/08 0636) BP: (136-143)/(80-96) 136/96 mmHg (09/08 0636) SpO2:  [94 %-99 %] 99 % (09/08 0636) Last BM Date: 05/08/15 PO not recorded 400 urine recorded. Afebrile, BP up x 1 this AM Labs all look good Intake/Output from previous day: 09/07 0701 - 09/08 0700 In: -  Out: 400 [Urine:400] Intake/Output this shift:    General appearance: alert, cooperative and no distress GI: soft, non-tender; bowel sounds normal; no masses,  no organomegaly  Lab Results:   Recent Labs  05/07/15 1054 05/08/15 0433  WBC 10.2 9.8  HGB 14.7 14.1  HCT 43.8 42.1  PLT 146* 139*    BMET  Recent Labs  05/07/15 1054 05/08/15 0433  NA 135 136  K 3.5 3.9  CL 101 105  CO2 27 24  GLUCOSE 102* 91  BUN 15 10  CREATININE 0.89 0.90  CALCIUM 9.1 8.4*   PT/INR No results for input(s): LABPROT, INR in the last 72 hours.   Recent Labs Lab 05/07/15 1054  AST 34  ALT 50  ALKPHOS 38  BILITOT 1.2  PROT 7.2  ALBUMIN 4.0     Lipase     Component Value Date/Time   LIPASE 19* 05/07/2015 1054     Studies/Results: Ct Abdomen Pelvis W Contrast  05/07/2015   CLINICAL DATA:  Abdominal pain, rectal bleeding, history of diverticulitis  EXAM: CT ABDOMEN AND PELVIS WITH CONTRAST  TECHNIQUE: Multidetector CT imaging of the abdomen and pelvis was performed using the standard protocol following bolus administration of intravenous contrast.  CONTRAST:  104mL OMNIPAQUE IOHEXOL 300 MG/ML SOLN, 176mL OMNIPAQUE IOHEXOL 300 MG/ML SOLN  COMPARISON:  None.  FINDINGS: Lung bases are unremarkable. Mild fatty infiltration of the liver. No focal hepatic mass. No calcified gallstones are noted  within gallbladder. No aortic aneurysm. The pancreas, spleen and adrenal glands are unremarkable. Kidneys are symmetrical in size and enhancement. No hydronephrosis or hydroureter. Delayed renal images shows bilateral renal symmetrical excretion. Bilateral visualized proximal ureter is unremarkable.  No small bowel obstruction. No adenopathy. Normal appendix is noted in axial image 54. There is no pericecal inflammation.  Few diverticula are noted in descending colon. In axial image 52 days inflammatory process in left lower quadrant at the junction of the descending colon with sigmoid colon. There is stranding of pericolonic fat. There is extraluminal air and oral contrast material as seen in axial image 56. Findings are consistent with perforated acute diverticulitis. There is no definite evidence of abscess. Additional extraluminal gas is noted within left lower quadrant axial image 45 within left mesentery.  Prostate gland and seminal vesicles are unremarkable. There is no distal colonic obstruction. Urinary bladder is unremarkable.  IMPRESSION: 1. Findings consistent with perforated acute diverticulitis left lower quadrant of the abdomen as described above. There is extraluminal air and contrast material best seen in axial image 56. The site of perforation is noted in left lower quadrant axial image 52. 2. Normal appendix.  No pericecal inflammation. 3. No small bowel or colonic obstruction. 4. No hydronephrosis or hydroureter. These results were called by telephone at the time of interpretation on 05/07/2015 at 1:12 pm to Dr.  Daleen Bo , who verbally acknowledged these results.   Electronically Signed   By: Lahoma Crocker M.D.   On: 05/07/2015 13:14    Medications: . enoxaparin (LOVENOX) injection  40 mg Subcutaneous Q24H  . piperacillin-tazobactam (ZOSYN)  IV  3.375 g Intravenous Q8H    Assessment/Plan Diverticulitis with microperforation (second episode) Antibiotics: Day 3 Zosyn DVT: Lovenox/SCD    Plan: Continue IV antibiotics, full liquids, Probiotic, today.  Consider transition to soft diet tomorrow, and oral antibiotic tomorrow. +    LOS: 1 day    Iran Kievit 05/09/2015

## 2015-05-09 NOTE — Telephone Encounter (Signed)
Left number to call back, unable to leave voicemail.

## 2015-05-10 MED ORDER — AMOXICILLIN-POT CLAVULANATE 875-125 MG PO TABS: 1.0000 | ORAL_TABLET | Freq: Two times a day (BID) | ORAL | Status: DC

## 2015-05-10 MED ORDER — ACETAMINOPHEN 325 MG PO TABS: 650.0000 mg | ORAL_TABLET | Freq: Four times a day (QID) | ORAL | Status: DC | PRN

## 2015-05-10 MED ORDER — SACCHAROMYCES BOULARDII 250 MG PO CAPS: ORAL_CAPSULE | ORAL | Status: DC

## 2015-05-10 MED ORDER — HYDROCODONE-ACETAMINOPHEN 5-325 MG PO TABS: 1.0000 | ORAL_TABLET | ORAL | Status: DC | PRN

## 2015-05-10 NOTE — Discharge Instructions (Signed)
Diverticulitis Diverticulitis is inflammation or infection of small pouches in your colon that form when you have a condition called diverticulosis. The pouches in your colon are called diverticula. Your colon, or large intestine, is where water is absorbed and stool is formed. Complications of diverticulitis can include:  Bleeding.  Severe infection.  Severe pain.  Perforation of your colon.  Obstruction of your colon. CAUSES  Diverticulitis is caused by bacteria. Diverticulitis happens when stool becomes trapped in diverticula. This allows bacteria to grow in the diverticula, which can lead to inflammation and infection. RISK FACTORS People with diverticulosis are at risk for diverticulitis. Eating a diet that does not include enough fiber from fruits and vegetables may make diverticulitis more likely to develop. SYMPTOMS  Symptoms of diverticulitis may include:  Abdominal pain and tenderness. The pain is normally located on the left side of the abdomen, but may occur in other areas.  Fever and chills.  Bloating.  Cramping.  Nausea.  Vomiting.  Constipation.  Diarrhea.  Blood in your stool. DIAGNOSIS  Your health care provider will ask you about your medical history and do a physical exam. You may need to have tests done because many medical conditions can cause the same symptoms as diverticulitis. Tests may include:  Blood tests.  Urine tests.  Imaging tests of the abdomen, including X-rays and CT scans. When your condition is under control, your health care provider may recommend that you have a colonoscopy. A colonoscopy can show how severe your diverticula are and whether something else is causing your symptoms. TREATMENT  Most cases of diverticulitis are mild and can be treated at home. Treatment may include:  Taking over-the-counter pain medicines.  Following a clear liquid diet.  Taking antibiotic medicines by mouth for 7-10 days. More severe cases may  be treated at a hospital. Treatment may include:  Not eating or drinking.  Taking prescription pain medicine.  Receiving antibiotic medicines through an IV tube.  Receiving fluids and nutrition through an IV tube.  Surgery. HOME CARE INSTRUCTIONS   Follow your health care provider's instructions carefully.  Follow a full liquid diet or other diet as directed by your health care provider. After your symptoms improve, your health care provider may tell you to change your diet. He or she may recommend you eat a high-fiber diet. Fruits and vegetables are good sources of fiber. Fiber makes it easier to pass stool.  Take fiber supplements or probiotics as directed by your health care provider.  Only take medicines as directed by your health care provider.  Keep all your follow-up appointments. SEEK MEDICAL CARE IF:   Your pain does not improve.  You have a hard time eating food.  Your bowel movements do not return to normal. SEEK IMMEDIATE MEDICAL CARE IF:   Your pain becomes worse.  Your symptoms do not get better.  Your symptoms suddenly get worse.  You have a fever.  You have repeated vomiting.  You have bloody or black, tarry stools. MAKE SURE YOU:   Understand these instructions.  Will watch your condition.  Will get help right away if you are not doing well or get worse. Document Released: 05/27/2005 Document Revised: 08/22/2013 Document Reviewed: 07/12/2013 [facility] Patient Information 2015 [place], [place]. This information is not intended to replace advice given to you by your health care provider. Make sure you discuss any questions you have with your health care provider.  Low-Fiber Diet for the next 2 weeks then go back to high fiber.  Fiber is found in fruits, vegetables, and whole grains. A low-fiber diet restricts fibrous foods that are not digested in the small intestine. A diet containing about 10-15 grams of fiber per day is considered low fiber.  Low-fiber diets may be used to:  Promote healing and rest the bowel during intestinal flare-ups.  Prevent blockage of a partially obstructed or narrowed gastrointestinal tract.  Reduce fecal weight and volume.  Slow the movement of feces. You may be on a low-fiber diet as a transitional diet following surgery, after an injury (trauma), or because of a short (acute) or lifelong (chronic) illness. Your health care provider will determine the length of time you need to stay on this diet.  WHAT DO I NEED TO KNOW ABOUT A LOW-FIBER DIET? Always check the fiber content on the packaging's Nutrition Facts label, especially on foods from the grains list. Ask your dietitian if you have questions about specific foods that are related to your condition, especially if the food is not listed below. In general, a low-fiber food will have less than 2 g of fiber. WHAT FOODS CAN I EAT? Grains All breads and crackers made with white flour. Sweet rolls, doughnuts, waffles, pancakes, [place] toast, bagels. Pretzels, Melba toast, zwieback. Well-cooked cereals, such as cornmeal, farina, or cream cereals. Dry cereals that do not contain whole grains, fruit, or nuts, such as refined corn, wheat, rice, and oat cereals. Potatoes prepared any way without skins, plain pastas and noodles, refined white rice. Use white flour for baking and making sauces. Use allowed list of grains for casseroles, dumplings, and puddings.  Vegetables Strained tomato and vegetable juices. Fresh lettuce, cucumber, spinach. Well-cooked (no skin or pulp) or canned vegetables, such as asparagus, bean sprouts, beets, carrots, green beans, mushrooms, potatoes, pumpkin, spinach, yellow squash, tomato sauce/puree, turnips, yams, and zucchini. Keep servings limited to  cup.  Fruits All fruit juices except prune juice. Cooked or canned fruits without skin and seeds, such as applesauce, apricots, cherries, fruit cocktail, grapefruit, grapes, mandarin oranges,  melons, peaches, pears, pineapple, and plums. Fresh fruits without skin, such as apricots, avocados, bananas, melons, pineapple, nectarines, and peaches. Keep servings limited to  cup or 1 piece.  Meat and Other Protein Sources Ground or well-cooked tender beef, ham, veal, lamb, pork, or poultry. Eggs, plain cheese. Fish, oysters, shrimp, lobster, and other seafood. Liver, organ meats. Smooth nut butters. Dairy All milk products and alternative dairy substitutes, such as soy, rice, almond, and coconut, not containing added whole nuts, seeds, or added fruit. Beverages Decaf coffee, fruit, and vegetable juices or smoothies (small amounts, with no pulp or skins, and with fruits from allowed list), sports drinks, herbal tea. Condiments Ketchup, mustard, vinegar, cream sauce, cheese sauce, cocoa powder. Spices in moderation, such as allspice, basil, bay leaves, celery powder or leaves, cinnamon, cumin powder, curry powder, ginger, mace, marjoram, onion or garlic powder, oregano, paprika, parsley flakes, ground pepper, rosemary, sage, savory, tarragon, thyme, and turmeric. Sweets and Desserts Plain cakes and cookies, pie made with allowed fruit, pudding, custard, cream pie. Gelatin, fruit, ice, sherbet, frozen ice pops. Ice cream, ice milk without nuts. Plain hard candy, honey, jelly, molasses, syrup, sugar, chocolate syrup, gumdrops, marshmallows. Limit overall sugar intake.  Fats and Oil Margarine, butter, cream, mayonnaise, salad oils, plain salad dressings made from allowed foods. Choose healthy fats such as olive oil, canola oil, and omega-3 fatty acids (such as found in salmon or tuna) when possible.  Other Bouillon, broth, or cream soups made from  allowed foods. Any strained soup. Casseroles or mixed dishes made with allowed foods. The items listed above may not be a complete list of recommended foods or beverages. Contact your dietitian for more options.  WHAT FOODS ARE NOT  RECOMMENDED? Grains All whole wheat and whole grain breads and crackers. Multigrains, rye, bran seeds, nuts, or coconut. Cereals containing whole grains, multigrains, bran, coconut, nuts, raisins. Cooked or dry oatmeal, steel-cut oats. Coarse wheat cereals, granola. Cereals advertised as high fiber. Potato skins. Whole grain pasta, wild or brown rice. Popcorn. Coconut flour. Bran, buckwheat, corn bread, multigrains, rye, wheat germ.  Vegetables Fresh, cooked or canned vegetables, such as artichokes, asparagus, beet greens, broccoli, Brussels sprouts, cabbage, celery, cauliflower, corn, eggplant, kale, legumes or beans, okra, peas, and tomatoes. Avoid large servings of any vegetables, especially raw vegetables.  Fruits Fresh fruits, such as apples with or without skin, berries, cherries, figs, grapes, grapefruit, guavas, kiwis, mangoes, oranges, papayas, pears, persimmons, pineapple, and pomegranate. Prune juice and juices with pulp, stewed or dried prunes. Dried fruits, dates, raisins. Fruit seeds or skins. Avoid large servings of all fresh fruits. Meats and Other Protein Sources Tough, fibrous meats with gristle. Chunky nut butter. Cheese made with seeds, nuts, or other foods not recommended. Nuts, seeds, legumes (beans, including baked beans), dried peas, beans, lentils.  Dairy Yogurt or cheese that contains nuts, seeds, or added fruit.  Beverages Fruit juices with high pulp, prune juice. Caffeinated coffee and teas.  Condiments Coconut, maple syrup, pickles, olives. Sweets and Desserts Desserts, cookies, or candies that contain nuts or coconut, chunky peanut butter, dried fruits. Jams, preserves with seeds, marmalade. Large amounts of sugar and sweets. Any other dessert made with fruits from the not recommended list.  Other Soups made from vegetables that are not recommended or that contain other foods not recommended.  The items listed above may not be a complete list of foods and beverages to  avoid. Contact your dietitian for more information. Document Released: 02/06/2002 Document Revised: 08/22/2013 Document Reviewed: 07/10/2013 [facility] Patient Information 2015 [place], [place]. This information is not intended to replace advice given to you by your health care provider. Make sure you discuss any questions you have with your health care provider.

## 2015-05-10 NOTE — Telephone Encounter (Signed)
After multiple attempts have been unable to reach pt. 

## 2015-05-10 NOTE — Progress Notes (Signed)
Patient tolerated the soft diet,no c/o nausea nor vomiting.Denies pain Had ambulated in the hallway several times this am. Stable on d/c. Presciptions and d/c instructions given, verbalized understanding,teach back utilized.

## 2015-05-10 NOTE — Progress Notes (Deleted)
Physician Discharge Summary  Patient ID: Samuel Sanford MRN: [id number] DOB/AGE: 1951-03-04 64 y.o.  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 He says it feels just like his diverticulitis flair 3 years ago. He was admitted for several days for that episode in [place] and medically treated with good result. He has not had any problems until the last few days. He also c/o diarrhea with a small amount of blood on the stools. He's never had any abdominal problems and rarely gets sick. He takes care of his disabled wife of 21 years. 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   He was admitted, made NPO, placed on IV Zosyn.  He made very good progress and diet was advanced.  By 05/10/15 he was pain free.  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  We will give him Zosyn this Am and send home on 5 more days of Augmentin.  Follow up with PCP and Dr. Dalbert Sanford.  Condition on d/c:  Improved  Disposition:  Home     Medication List    STOP taking these medications        ciprofloxacin 500 MG tablet  Commonly known as:  CIPRO     metroNIDAZOLE 500 MG tablet  Commonly known as:  FLAGYL     naproxen sodium 220 MG tablet  Commonly known as:  ANAPROX      TAKE these medications        acetaminophen 325 MG tablet  Commonly known as:  TYLENOL  Take 2 tablets (650 mg total) by mouth every 6 (six) hours as needed for mild pain (or temp > 100).      amoxicillin-clavulanate 875-125 MG per tablet  Commonly known as:  AUGMENTIN  Take 1 tablet by mouth every 12 (twelve) hours.     HYDROcodone-acetaminophen 5-325 MG per tablet  Commonly known as:  NORCO/VICODIN  Take 1-2 tablets by mouth every 4 (four) hours as needed for moderate pain.     saccharomyces boulardii 250 MG capsule  Commonly known as:  FLORASTOR  Use this for the next 2 weeks, you can buy over the counter at any drug store.     sildenafil 100 MG tablet  Commonly known as:  VIAGRA  Take 100 mg by mouth daily as needed for erectile dysfunction.           Follow-up Information    Follow up with Samuel Melter, MD.   Specialty:  Family Medicine   Why:  Call and allow him to see you and follow up with your diverticulitis   Contact information:   [address] [place] [address] [phone]       Follow up with Samuel Hector, MD. Schedule an appointment as soon as possible for a visit in 6 weeks.   Specialty:  General Surgery   Contact information:   [place]  [phone]  SignedEarnstine Sanford 05/10/2015, 8:06 AM

## 2015-05-10 NOTE — Progress Notes (Signed)
  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  He says he has no pain at all this AM.  Objective: Vital signs in last 24 hours: Temp:  [98.7 F (37.1 C)-98.9 F (37.2 C)] 98.9 F (37.2 C) (09/08 2105) Pulse Rate:  [66-98] 66 (09/08 2105) Resp:  [18] 18 (09/08 2105) BP: (149-157)/(73-87) 157/73 mmHg (09/08 2105) SpO2:  [97 %-99 %] 99 % (09/08 2105) Last BM Date: 05/09/15 1760 PO  Full liquids Stool x 1 Afebrile, VSS Labs OK 05/08/15 Intake/Output from previous day: 09/08 0701 - 09/09 0700 In: 5903.3 [P.O.:1760; I.V.:4143.3] Out: 650 [Urine:650] Intake/Output this shift:    General appearance: alert, cooperative and no distress GI: soft, non-tender; bowel sounds normal; no masses,  no organomegaly  Lab Results:   Recent Labs  05/07/15 1054 05/08/15 0433  WBC 10.2 9.8  HGB 14.7 14.1  HCT 43.8 42.1  PLT 146* 139*    BMET  Recent Labs  05/07/15 1054 05/08/15 0433  NA 135 136  K 3.5 3.9  CL 101 105  CO2 27 24  GLUCOSE 102* 91  BUN 15 10  CREATININE 0.89 0.90  CALCIUM 9.1 8.4*   PT/INR No results for input(s): LABPROT, INR in the last 72 hours.   Recent Labs Lab 05/07/15 1054  AST 34  ALT 50  ALKPHOS 38  BILITOT 1.2  PROT 7.2  ALBUMIN 4.0     Lipase     Component Value Date/Time   LIPASE 19* 05/07/2015 1054     Studies/Results: No results found.  Medications: . enoxaparin (LOVENOX) injection  40 mg Subcutaneous Q24H  . piperacillin-tazobactam (ZOSYN)  IV  3.375 g Intravenous Q8H  . saccharomyces boulardii  250 mg Oral BID    Assessment/Plan Diverticulitis with microperforation (second episode) Antibiotics: Day 4 Zosyn DVT: Lovenox/SCD    Plan:  Soft diet, continue IV antibiotics and see if we can consider d/c later this AM.  Discussed with Dr. Dalbert Batman and he is agreeable to this .  He can follow up with Dr. Dalbert Batman in 6 weeks.     LOS: 2 days    Samuel Sanford 05/10/2015

## 2015-05-10 NOTE — Discharge Summary (Signed)
Physician Discharge Summary  Patient ID: Samuel Sanford MRN: [id number] DOB/AGE: 03-Nov-1950 64 y.o.  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 He says it feels just like his diverticulitis flair 3 years ago. He was admitted for several days for that episode in [place] and medically treated with good result. He has not had any problems until the last few days. He also c/o diarrhea with a small amount of blood on the stools. He's never had any abdominal problems and rarely gets sick. He takes care of his disabled wife of 21 years. 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   He was admitted, made NPO, placed on IV Zosyn. He made very good progress and diet was advanced. By 05/10/15 he was pain free. 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 We will give him Zosyn this Am and send home on 5 more days of Augmentin. Follow up with PCP and Dr. Dalbert Batman.  Condition on d/c: Improved  Disposition: Home     Medication List    STOP taking these medications       ciprofloxacin 500 MG tablet  Commonly known as: CIPRO     metroNIDAZOLE 500 MG tablet  Commonly known as: FLAGYL     naproxen sodium 220 MG tablet  Commonly known as: ANAPROX      TAKE these medications       acetaminophen 325 MG tablet  Commonly known as: TYLENOL  Take 2 tablets (650 mg total) by mouth every 6 (six) hours as needed  for mild pain (or temp > 100).     amoxicillin-clavulanate 875-125 MG per tablet  Commonly known as: AUGMENTIN  Take 1 tablet by mouth every 12 (twelve) hours.     HYDROcodone-acetaminophen 5-325 MG per tablet  Commonly known as: NORCO/VICODIN  Take 1-2 tablets by mouth every 4 (four) hours as needed for moderate pain.     saccharomyces boulardii 250 MG capsule  Commonly known as: FLORASTOR  Use this for the next 2 weeks, you can buy over the counter at any drug store.     sildenafil 100 MG tablet  Commonly known as: VIAGRA  Take 100 mg by mouth daily as needed for erectile dysfunction.           Follow-up Information    Follow up with Orpah Melter, MD.   Specialty: Family Medicine   Why: Call and allow him to see you and follow up with your diverticulitis   Contact information:   [address] [place] [address] [phone]       Follow up with [name], MD. Schedule an appointment as soon as possible for a visit in 6 weeks.   Specialty: General Surgery   Contact information:   [address] [place] [address] [phone]       Signed: Earnstine Regal 05/10/2015, 8:06 AM         Revision History  Date/Time User Provider Type Action   05/10/2015 8:15 AM Earnstine Regal, PA-C Physician Assistant Incomplete Revision   05/10/2015 8:11 AM Earnstine Regal, PA-C Physician Assistant Sign           This note is locked. Earnstine Regal, PA-C has had an incomplete edit since 05/10/2015 8:15 AM.  Earnstine Regal, PA-C Physician Assistant In Progress Surgery Progress Notes 05/10/2015 8:06 AM    Expand All Collapse All   Physician Discharge Summary  Patient ID: Samuel Sanford MRN: [id number] DOB/AGE: Nov 13, 1950 64 y.o.  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 He says it feels just like his diverticulitis flair 3 years ago. He was admitted for several days for that episode in [place] and medically treated with good result. He has not had any problems until the last few days. He also c/o diarrhea with a small amount of blood on the stools. He's never had any abdominal problems and rarely gets sick. He takes care of his disabled wife of 21 years. 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   He was admitted, made NPO, placed on IV Zosyn. He made very good progress and diet was advanced. By 05/10/15 he was pain free. 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 We will give him Zosyn this Am and send home on 5 more days of Augmentin. Follow up with PCP and Dr. Dalbert Batman.  Condition on d/c: Improved  Disposition: Home     Medication List    STOP taking these medications       ciprofloxacin 500 MG tablet  Commonly known as: CIPRO     metroNIDAZOLE 500 MG tablet  Commonly known as: FLAGYL     naproxen sodium 220 MG tablet  Commonly known as: ANAPROX      TAKE these medications       acetaminophen 325 MG tablet  Commonly known as: TYLENOL  Take 2 tablets (650 mg total) by mouth every 6 (six) hours as needed for mild pain (or temp > 100).     amoxicillin-clavulanate 875-125 MG per tablet  Commonly known as: AUGMENTIN  Take 1 tablet by mouth every 12 (twelve) hours.     HYDROcodone-acetaminophen 5-325 MG per tablet  Commonly known as:  NORCO/VICODIN  Take 1-2 tablets by mouth every 4 (four) hours as needed for moderate pain.     saccharomyces boulardii 250 MG capsule  Commonly known as: FLORASTOR  Use this for the next 2 weeks, you can buy over the counter at any drug store.     sildenafil 100 MG tablet  Commonly known as: VIAGRA  Take 100 mg by mouth daily as needed for erectile dysfunction.           Follow-up Information    Follow up with Orpah Melter, MD.   Specialty: Family Medicine   Why: Call and allow him to see you and follow up with your diverticulitis   Contact information:   [address]  [place] [address] [phone]       Follow up with [name], MD. Schedule an appointment as soon as possible for a visit in 6 weeks.   Specialty: General Surgery   Contact information:   [address] [place] [address] [phone]       Signed: Earnstine Regal 05/10/2015, 8:06 AM

## 2015-07-22 ENCOUNTER — Telehealth: Payer: Self-pay | Admitting: Internal Medicine

## 2015-07-22 NOTE — Telephone Encounter (Signed)
Pt scheduled to see Dr. Henrene Pastor 08/27/15@10 :45am. Left message for pt to call back.

## 2015-07-23 NOTE — Telephone Encounter (Signed)
Patient is aware of appointment °

## 2015-08-27 ENCOUNTER — Ambulatory Visit (INDEPENDENT_AMBULATORY_CARE_PROVIDER_SITE_OTHER): Payer: BLUE CROSS/BLUE SHIELD | Admitting: Internal Medicine

## 2015-08-27 ENCOUNTER — Encounter: Payer: Self-pay | Admitting: Internal Medicine

## 2015-08-27 VITALS — BP 122/86 | HR 72 | Ht 68.0 in | Wt 165.2 lb

## 2015-08-27 DIAGNOSIS — K5730 Diverticulosis of large intestine without perforation or abscess without bleeding: Secondary | ICD-10-CM | POA: Diagnosis not present

## 2015-08-27 DIAGNOSIS — Z86010 Personal history of colonic polyps: Secondary | ICD-10-CM | POA: Diagnosis not present

## 2015-08-27 DIAGNOSIS — K5732 Diverticulitis of large intestine without perforation or abscess without bleeding: Secondary | ICD-10-CM | POA: Diagnosis not present

## 2015-08-27 NOTE — Patient Instructions (Signed)
Please follow up as needed 

## 2015-08-27 NOTE — Progress Notes (Signed)
HISTORY OF PRESENT ILLNESS:  Samuel Sanford "Samuel Sanford" is a 64 y.o. male  who was initially evaluated in February 2014 after an episode of uncomplicated acute diverticulitis in Samuel Sanford (CT scan revealed acute sigmoid diverticulitis without complication). He subsequently underwent colonoscopy, his first, 11/03/2012. He was found to have a diminutive descending colon polyp which was removed (no tissue but appeared adenomatous) and moderate left-sided diverticulosis. Follow-up in 5 years recommended. He was well until early September when he developed lower abdominal discomfort for which she was seen in urgent care and prescribed antibiotics. His pain persisted and was quite severe necessitating emergency room evaluation. CT scan demonstrated acute diverticulitis with microperforation for which she was admitted to the surgical service. He was treated with probiotics and discharged home 3 days later. Blood work, CT scan, and surgical notes reviewed. He was initially seen by Dr. Fanny Sanford. He also saw Dr. Dalbert Sanford in follow-up. Sigmoid colectomy discuss. The patient was initially reluctant but now enthusiastic for surgery fearing recurrent attack with possible worsening outcome and the need for emergent surgery. He has discussed this with friends. He presents today with Mrs. Samuel Sanford for my opinion. Currently feeling well but remains anxious regarding the possibility of recurrent diverticulitis. He describes normal bowel movements. No bleeding or pain.  REVIEW OF SYSTEMS:  All non-GI ROS negative on extensive review  Past Medical History  Diagnosis Date  . Allergic rhinitis   . Diverticulitis   . Exposure to hepatitis C     Never had the infection, worked with inmates for drug rehab  . Anxiety   . Colon polyps     History reviewed. No pertinent past surgical history.  Social History Samuel Sanford  reports that he quit smoking about 21 years ago. His smoking use included Cigars.  He has quit using smokeless tobacco. His smokeless tobacco use included Snuff. He reports that he drinks alcohol. He reports that he does not use illicit drugs.  family history includes Heart disease in his mother. There is no history of Colon cancer.  No Known Allergies     PHYSICAL EXAMINATION: Vital signs: BP 122/86 mmHg  Pulse 72  Ht 5\' 8"  (1.727 m)  Wt 165 lb 3.2 oz (74.934 kg)  BMI 25.12 kg/m2 General: Well-developed, well-nourished, no acute distress Skin: Multiple tattoos HEENT: Sclerae are anicteric, conjunctiva pink. Oral mucosa intact Lungs: Clear Heart: Regular Abdomen: soft, nontender, nondistended, no obvious ascites, no peritoneal signs, normal bowel sounds. No organomegaly. Multiple tattoos Extremities: No clubbing cyanosis or edema Psychiatric: alert and oriented x3. Cooperative  ASSESSMENT:  #1. Recurrent acute diverticulitis. Most recent episode with microperforation as described. Responded to medical therapy and doing well clinically. The patient is appropriately concerned about recurrent complicated diverticular disease and currently interested in surgery. #2. Colonoscopy March 2014 with left-sided diverticulosis and diminutive polyp (likely adenoma)  PLAN:  #1. I support elective surgical resection in this patient. He has had complicated disease and is interested in avoiding future complicated episodes if at all possible. His body habitus and overall health make him an excellent candidate. We have discussed this. As such, I will advise that he notify Dr. Dalbert Sanford regarding such. #2. Diminutive colon polyp on colonoscopy March 2014. Likely adenoma  25 minutes was spent face-to-face with the patient. Greater than 50% of the time was used for counseling regarding diverticular disease and the role of surgical intervention

## 2015-11-11 ENCOUNTER — Other Ambulatory Visit: Payer: Self-pay | Admitting: General Surgery

## 2015-11-11 DIAGNOSIS — K5732 Diverticulitis of large intestine without perforation or abscess without bleeding: Secondary | ICD-10-CM

## 2015-11-20 ENCOUNTER — Other Ambulatory Visit: Payer: BLUE CROSS/BLUE SHIELD

## 2015-11-25 ENCOUNTER — Ambulatory Visit
Admission: RE | Admit: 2015-11-25 | Discharge: 2015-11-25 | Disposition: A | Discharge status: Home / Self Care | Payer: BLUE CROSS/BLUE SHIELD | Source: Ambulatory Visit | Attending: General Surgery | Admitting: General Surgery

## 2015-11-25 DIAGNOSIS — K5732 Diverticulitis of large intestine without perforation or abscess without bleeding: Secondary | ICD-10-CM

## 2015-12-02 ENCOUNTER — Other Ambulatory Visit: Payer: Self-pay | Admitting: General Surgery

## 2016-01-13 NOTE — Patient Instructions (Addendum)
Samuel Sanford  01/13/2016   Your procedure is scheduled on: Thursday 05 /18/2017  Report to [facility]  Entrance take [facility]  elevators to 3rd floor to  [place] at  [place].  Call this number if you have problems the morning of surgery [phone]   Remember: ONLY 1 PERSON MAY GO WITH YOU TO SHORT STAY TO GET  READY MORNING OF [place].   Do not eat food or drink liquids :After Midnight on Tuesday 01/14/2016!    CLEAR LIQUID DIET   Foods Allowed                                                                     Foods Excluded  Coffee and tea, regular and decaf                             liquids that you cannot  Plain Jell-O in any flavor                                             see through such as: Fruit ices (not with fruit pulp)                                     milk, soups, orange juice  Iced Popsicles                                    All solid food Carbonated beverages, regular and diet                                    Cranberry, grape and apple juices Sports drinks like Gatorade Lightly seasoned clear broth or consume(fat free) Sugar, honey syrup  Sample Menu Breakfast                                Lunch                                     Supper Cranberry juice                    Beef broth                            Chicken broth Jell-O                                     Grape juice  Apple juice Coffee or tea                        Jell-O                                      Popsicle                                                Coffee or tea                        Coffee or tea  _____________________________________________________________________               FOLLOW BOWEL PREP INSTRUCTIONS FROM DR.INGRAM'S OFFICE WITH A CLEAR LIQUID DIET TOMORROW ALL DAY TIL MIDNIGHT!         Take these medicines the morning of surgery with A SIP OF WATER: none                           You  may not have any metal on your body including hair pins and              piercings  Do not wear jewelry, make-up, lotions, powders or perfumes, deodorant             Do not wear nail polish.  Do not shave  48 hours prior to surgery.              Men may shave face and neck.   Do not bring valuables to the hospital. [place].  Contacts, dentures or bridgework may not be worn into surgery.  Leave suitcase in the car. After surgery it may be brought to your room.                  Please read over the following fact sheets you were given: _____________________________________________________________________             [facility] - Preparing for Surgery Before surgery, you can play an important role.  Because skin is not sterile, your skin needs to be as free of germs as possible.  You can reduce the number of germs on your skin by washing with CHG (chlorahexidine gluconate) soap before surgery.  CHG is an antiseptic cleaner which kills germs and bonds with the skin to continue killing germs even after washing. Please DO NOT use if you have an allergy to CHG or antibacterial soaps.  If your skin becomes reddened/irritated stop using the CHG and inform your nurse when you arrive at Short Stay. Do not shave (including legs and underarms) for at least 48 hours prior to the first CHG shower.  You may shave your face/neck. Please follow these instructions carefully:  1.  Shower with CHG Soap the night before surgery and the  morning of Surgery.  2.  If you choose to wash your hair, wash your hair first as usual with your  normal  shampoo.  3.  After you shampoo, rinse your hair and body thoroughly to remove the  shampoo.  4.  Use CHG as you would any other liquid soap.  You can apply chg directly  to the skin and wash                       Gently with a scrungie or clean washcloth.  5.  Apply the CHG Soap to your body ONLY  FROM THE NECK DOWN.   Do not use on face/ open                           Wound or open sores. Avoid contact with eyes, ears mouth and genitals (private parts).                       Wash face,  Genitals (private parts) with your normal soap.             6.  Wash thoroughly, paying special attention to the area where your surgery  will be performed.  7.  Thoroughly rinse your body with warm water from the neck down.  8.  DO NOT shower/wash with your normal soap after using and rinsing off  the CHG Soap.                9.  Pat yourself dry with a clean towel.            10.  Wear clean pajamas.            11.  Place clean sheets on your bed the night of your first shower and do not  sleep with pets. Day of Surgery : Do not apply any lotions/deodorants the morning of surgery.  Please wear clean clothes to the hospital/surgery center.  FAILURE TO FOLLOW THESE INSTRUCTIONS MAY RESULT IN THE CANCELLATION OF YOUR SURGERY PATIENT SIGNATURE_________________________________  NURSE SIGNATURE__________________________________  ________________________________________________________________________   [name]  An incentive spirometer is a tool that can help keep your lungs clear and active. This tool measures how well you are filling your lungs with each breath. Taking long deep breaths may help reverse or decrease the chance of developing breathing (pulmonary) problems (especially infection) following:  A long period of time when you are unable to move or be active. BEFORE THE PROCEDURE   If the spirometer includes an indicator to show your best effort, your nurse or respiratory therapist will set it to a desired goal.  If possible, sit up straight or lean slightly forward. Try not to slouch.  Hold the incentive spirometer in an upright position. INSTRUCTIONS FOR USE   Sit on the edge of your bed if possible, or sit up as far as you can in bed or on a chair.  Hold the incentive  spirometer in an upright position.  Breathe out normally.  Place the mouthpiece in your mouth and seal your lips tightly around it.  Breathe in slowly and as deeply as possible, raising the piston or the ball toward the top of the column.  Hold your breath for 3-5 seconds or for as long as possible. Allow the piston or ball to fall to the bottom of the column.  Remove the mouthpiece from your mouth and breathe out normally.  Rest for a few seconds and repeat Steps 1 through 7 at least 10 times every 1-2 hours when you are awake. Take your time and take a few normal breaths between deep breaths.  The spirometer may include an indicator to  show your best effort. Use the indicator as a goal to work toward during each repetition.  After each set of 10 deep breaths, practice coughing to be sure your lungs are clear. If you have an incision (the cut made at the time of surgery), support your incision when coughing by placing a pillow or rolled up towels firmly against it. Once you are able to get out of bed, walk around indoors and cough well. You may stop using the incentive spirometer when instructed by your caregiver.  RISKS AND COMPLICATIONS  Take your time so you do not get dizzy or light-headed.  If you are in pain, you may need to take or ask for pain medication before doing incentive spirometry. It is harder to take a deep breath if you are having pain. AFTER USE  Rest and breathe slowly and easily.  It can be helpful to keep track of a log of your progress. Your caregiver can provide you with a simple table to help with this. If you are using the spirometer at home, follow these instructions: [place] IF:   You are having difficultly using the spirometer.  You have trouble using the spirometer as often as instructed.  Your pain medication is not giving enough relief while using the spirometer.  You develop fever of 100.5 F (38.1 C) or higher. SEEK IMMEDIATE MEDICAL  CARE IF:   You cough up bloody sputum that had not been present before.  You develop fever of 102 F (38.9 C) or greater.  You develop worsening pain at or near the incision site. MAKE SURE YOU:   Understand these instructions.  Will watch your condition.  Will get help right away if you are not doing well or get worse. Document Released: 12/28/2006 Document Revised: 11/09/2011 Document Reviewed: 02/28/2007 ExitCare Patient Information 2014 ExitCare, [place].   ________________________________________________________________________  WHAT IS A BLOOD TRANSFUSION? Blood Transfusion Information  A transfusion is the replacement of blood or some of its parts. Blood is made up of multiple cells which provide different functions.  Red blood cells carry oxygen and are used for blood loss replacement.  White blood cells fight against infection.  Platelets control bleeding.  Plasma helps clot blood.  Other blood products are available for specialized needs, such as hemophilia or other clotting disorders. BEFORE THE TRANSFUSION  Who gives blood for transfusions?   Healthy volunteers who are fully evaluated to make sure their blood is safe. This is blood bank blood. Transfusion therapy is the safest it has ever been in the practice of medicine. Before blood is taken from a donor, a complete history is taken to make sure that person has no history of diseases nor engages in risky social behavior (examples are intravenous drug use or sexual activity with multiple partners). The donor's travel history is screened to minimize risk of transmitting infections, such as malaria. The donated blood is tested for signs of infectious diseases, such as HIV and hepatitis. The blood is then tested to be sure it is compatible with you in order to minimize the chance of a transfusion reaction. If you or a relative donates blood, this is often done in anticipation of surgery and is not appropriate for  emergency situations. It takes many days to process the donated blood. RISKS AND COMPLICATIONS Although transfusion therapy is very safe and saves many lives, the main dangers of transfusion include:   Getting an infectious disease.  Developing a transfusion reaction. This is an allergic reaction  to something in the blood you were given. Every precaution is taken to prevent this. The decision to have a blood transfusion has been considered carefully by your caregiver before blood is given. Blood is not given unless the benefits outweigh the risks. AFTER THE TRANSFUSION  Right after receiving a blood transfusion, you will usually feel much better and more energetic. This is especially true if your red blood cells have gotten low (anemic). The transfusion raises the level of the red blood cells which carry oxygen, and this usually causes an energy increase.  The nurse administering the transfusion will monitor you carefully for complications. HOME CARE INSTRUCTIONS  No special instructions are needed after a transfusion. You may find your energy is better. Speak with your caregiver about any limitations on activity for underlying diseases you may have. SEEK MEDICAL CARE IF:   Your condition is not improving after your transfusion.  You develop redness or irritation at the intravenous (IV) site. SEEK IMMEDIATE MEDICAL CARE IF:  Any of the following symptoms occur over the next 12 hours:  Shaking chills.  You have a temperature by mouth above 102 F (38.9 C), not controlled by medicine.  Chest, back, or muscle pain.  People around you feel you are not acting correctly or are confused.  Shortness of breath or difficulty breathing.  Dizziness and fainting.  You get a rash or develop hives.  You have a decrease in urine output.  Your urine turns a dark color or changes to pink, red, or brown. Any of the following symptoms occur over the next 10 days:  You have a temperature by  mouth above 102 F (38.9 C), not controlled by medicine.  Shortness of breath.  Weakness after normal activity.  The white part of the eye turns yellow (jaundice).  You have a decrease in the amount of urine or are urinating less often.  Your urine turns a dark color or changes to pink, red, or brown. Document Released: 08/14/2000 Document Revised: 11/09/2011 Document Reviewed: 04/02/2008 [facility] Patient Information 2014 [place], [place].  _______________________________________________________________________

## 2016-01-14 ENCOUNTER — Encounter (HOSPITAL_COMMUNITY): Payer: Self-pay

## 2016-01-14 ENCOUNTER — Encounter (INDEPENDENT_AMBULATORY_CARE_PROVIDER_SITE_OTHER): Payer: Self-pay

## 2016-01-14 ENCOUNTER — Encounter (HOSPITAL_COMMUNITY)
Admission: RE | Admit: 2016-01-14 | Discharge: 2016-01-14 | Disposition: A | Discharge status: Home / Self Care | Payer: BLUE CROSS/BLUE SHIELD | Source: Ambulatory Visit | Attending: General Surgery | Admitting: General Surgery

## 2016-01-14 DIAGNOSIS — Z01812 Encounter for preprocedural laboratory examination: Secondary | ICD-10-CM

## 2016-01-14 DIAGNOSIS — K5792 Diverticulitis of intestine, part unspecified, without perforation or abscess without bleeding: Secondary | ICD-10-CM | POA: Insufficient documentation

## 2016-01-14 LAB — COMPREHENSIVE METABOLIC PANEL
ALBUMIN: 4.4 g/dL (ref 3.5–5.0)
ALT: 84 U/L — AB (ref 17–63)
AST: 57 U/L — AB (ref 15–41)
Alkaline Phosphatase: 42 U/L (ref 38–126)
Anion gap: 7 (ref 5–15)
BILIRUBIN TOTAL: 0.8 mg/dL (ref 0.3–1.2)
BUN: 18 mg/dL (ref 6–20)
CHLORIDE: 109 mmol/L (ref 101–111)
CO2: 24 mmol/L (ref 22–32)
CREATININE: 0.88 mg/dL (ref 0.61–1.24)
Calcium: 9.2 mg/dL (ref 8.9–10.3)
GFR calc Af Amer: >60 mL/min (ref >60)
GLUCOSE: 105 mg/dL — AB (ref 65–99)
Potassium: 3.6 mmol/L (ref 3.5–5.1)
Sodium: 140 mmol/L (ref 135–145)
TOTAL PROTEIN: 7.5 g/dL (ref 6.5–8.1)

## 2016-01-14 LAB — CBC WITH DIFFERENTIAL/PLATELET
BASOS ABS: 0 10*3/uL (ref 0.0–0.1)
Basophils Relative: 0 %
Eosinophils Absolute: 0.1 10*3/uL (ref 0.0–0.7)
Eosinophils Relative: 2 %
HEMATOCRIT: 43.1 % (ref 39.0–52.0)
Hemoglobin: 14.5 g/dL (ref 13.0–17.0)
LYMPHS PCT: 44 %
Lymphs Abs: 2.4 10*3/uL (ref 0.7–4.0)
MCH: 28.7 pg (ref 26.0–34.0)
MCHC: 33.6 g/dL (ref 30.0–36.0)
MCV: 85.3 fL (ref 78.0–100.0)
Monocytes Absolute: 0.6 10*3/uL (ref 0.1–1.0)
Monocytes Relative: 11 %
NEUTROS ABS: 2.3 10*3/uL (ref 1.7–7.7)
NEUTROS PCT: 43 %
Platelets: 138 10*3/uL — ABNORMAL LOW (ref 150–400)
RBC: 5.05 MIL/uL (ref 4.22–5.81)
RDW: 14.8 % (ref 11.5–15.5)
WBC: 5.3 10*3/uL (ref 4.0–10.5)

## 2016-01-14 LAB — ABO/RH: ABO/RH(D): B POS

## 2016-01-14 NOTE — H&P (Signed)
Samuel Sanford  Location: [facility] Patient #: [id number] DOB: 1951-08-08 Married / Language: English / Race: White Male       History of Present Illness   The patient is a 65 year old male who presents with diverticulitis. This is a very pleasant 65 year old gentleman who returns for preoperative counseling regarding his diverticulitis. Dr. Christella Noa is his PCP. Dr. Scarlette Shorts is his gastroenterologist.  He is still motivated to have elective left colectomy for diverticulitis. He's had no pain since I last saw him. He is having daily solid bowel movements. Sees no blood. Last colonoscopy was by Dr. Scarlette Shorts on November 03, 2012 and showed distal descending and sigmoid diverticulosis. His first episode of diverticulitis as was 66 in [place]. The CT scan then showed no complication. He was asymptomatic for 2 years and then in September 2016 he was hospitalized in [place] for 4 days with acute abdominal pain. He says this was very severe. CT scan showed some inflammatory changes at the junction between the distal descending and sigmoid colon and a tiny amount of extraluminal air, possibly a tiny amount of extraluminal contrast. He responded very quickly and was discharged home on Cipro and Flagyl and has basically been asymptomatic for the past 8 months.  He had a friend that had open perforation with peritonitis requiring colostomy after the third episode and does not want to have that happen to him.  A barium enema was performed on November 25, 2015. This shows moderate to severe diverticulosis of the distal descending and sigmoid colon, maximal in the sigmoid. There was a slight contour deformity of the mid descending colon and the radiologist theorized that this was at the perforation site in September and that this might be scarring. There was no stricture or fistula. The rest of the colon looked fine. The rectum looked fine.  I discussed all  of these issues with him. He was to go ahead with surgery in May. He will be scheduled for laparoscopic assisted left colectomy, takedown splenic flexure, possible open colectomy, possible temporary diverting loop ileostomy. I discussed the indications, details, techniques, and numerous risk of the surgery with him and his wife. He is aware of the risk of bleeding, infection, conversion to open laparotomy, wound infection, wound hernia, injury to adjacent organs such as the ureter small bowel or major vascular structures with major reconstructive surgery, anastomotic leak requiring reoperation and colostomy, cardiac pulmonary thromboembolic problems. He understands all these issues well. All of his questions are answered. He agrees with this plan. We have gone over printed information last time.  I went over the mechanical and antibiotic bowel prep with him and called in his prescriptions.   Allergies No Known Drug Allergies10/24/2016  Medication History  Viagra (100MG  Tablet, Oral) Active. Fish Oil (1000MG  Capsule DR, Oral) Active. CoQ10 (200MG  Capsule, Oral) Active. Cinnamon (500MG  Capsule, Oral) Active. Caltrate 600 + D (600-125MG -IU Tablet, Oral) Active. Remeron (30MG  Tablet, Oral) Active: stimulate appetite. Medications Reconciled  Vitals  Weight: 164 lb Height: 68in Body Surface Area: 1.88 m Body Mass Index: 24.94 kg/m  Temp.: 97.50F  Pulse: 73 (Regular)  BP: 138/76 (Sitting, Left Arm, Standard)       Physical Exam  General Mental Status-Alert. General Appearance-Not in acute distress. Build & Nutrition-Well nourished. Posture-Normal posture. Gait-Normal.  Head and Neck Head-normocephalic, atraumatic with no lesions or palpable masses. Trachea-midline. Thyroid Gland Characteristics - normal size and consistency and no palpable nodules.  Chest and Lung Exam  Chest and lung exam reveals -on auscultation, normal breath  sounds, no adventitious sounds and normal vocal resonance.  Cardiovascular Cardiovascular examination reveals -normal heart sounds, regular rate and rhythm with no murmurs and femoral artery auscultation bilaterally reveals normal pulses, no bruits, no thrills.  Abdomen Note: Large tattoo of native American [place] extends down into the upper half of the abdomen. No scars or hernias. Soft and nontender without mass. Deep palpation left lower quadrant feels okay to me he says perhaps a little discomfort. Inguinal areas reveal no mass or adenopathy.   Neurologic Neurologic evaluation reveals -alert and oriented x 3 with no impairment of recent or remote memory, normal attention span and ability to concentrate, normal sensation and normal coordination.  Musculoskeletal Normal Exam - Bilateral-Upper Extremity Strength Normal and Lower Extremity Strength Normal.    Assessment & Plan  DIVERTICULITIS, COLON (K57.32)  Pt Education - Pamphlet Given - Laparoscopic Colorectal Surgery: discussed with patient and provided information.  Started Neomycin Sulfate 500MG , 2 (two) Tablet SEE NOTE, #6, 12/02/2015, No Refill. Local Order: TAKE TWO TABLETS AT 2 PM, 3 PM, AND 10 PM THE DAY PRIOR TO SURGERY Started Flagyl 500MG , 2 (two) Tablet SEE NOTE, #6, 12/02/2015, No Refill. Local Order: Take at 2pm, 3pm, and 10pm the day prior to your colon operation You are being scheduled for surgery - Our schedulers will call you.   We are pleased that you have had no further episodes of acute diverticulitis since we last saw you. Your barium enema shows a localized area of diverticulitis starting in the descending colon and extending well into the sigmoid colon As I described, this is the area that will need to be resected  You will be scheduled for laparoscopic assisted left colectomy, possible open resection, possible diverting loop ileostomy We discussed the indications, techniques, and numerous  risks of this surgery in detail  you will need to undergo a mechanical bowel prep and an antibiotic bowel prep the day before surgery Our nursing staff will review this with you and you will be given written instructions. The antibiotics have already been called into her pharmacy Given also call at any time if you have any questions. At your request, this will be scheduled in May at [facility] hospital Please start a clear liquid diet 36 hours prior to the surgery    Edsel Petrin. Dalbert Batman, M.D., [facility] General and Minimally invasive Surgery Breast and Colorectal Surgery Office:   [phone] Pager:   [phone]

## 2016-01-15 LAB — HEMOGLOBIN A1C
HEMOGLOBIN A1C: 5.7 % — AB (ref 4.8–5.6)
Mean Plasma Glucose: 117 mg/dL

## 2016-01-15 NOTE — Anesthesia Preprocedure Evaluation (Addendum)
Anesthesia Evaluation  Patient identified by MRN, date of birth, ID band Patient awake    Reviewed: Allergy & Precautions, NPO status , Patient's Chart, lab work & pertinent test results  Airway Mallampati: II  TM Distance: >3 FB Neck ROM: Full    Dental  (+) Dental Advisory Given, Edentulous Upper, Partial Lower, Missing, Poor Dentition, Chipped   Pulmonary Current Smoker,    Pulmonary exam normal        Cardiovascular negative cardio ROS Normal cardiovascular exam     Neuro/Psych PSYCHIATRIC DISORDERS Anxiety negative neurological ROS     GI/Hepatic Neg liver ROS,   Endo/Other  negative endocrine ROS  Renal/GU negative Renal ROS  negative genitourinary   Musculoskeletal negative musculoskeletal ROS (+)   Abdominal   Peds negative pediatric ROS (+)  Hematology negative hematology ROS (+)   Anesthesia Other Findings   Reproductive/Obstetrics negative OB ROS                          Anesthesia Physical Anesthesia Plan  ASA: II  Anesthesia Plan: General   Post-op Pain Management:    Induction: Intravenous  Airway Management Planned: Oral ETT  Additional Equipment:   Intra-op Plan:   Post-operative Plan: Extubation in OR  Informed Consent: I have reviewed the patients History and Physical, chart, labs and discussed the procedure including the risks, benefits and alternatives for the proposed anesthesia with the patient or authorized representative who has indicated his/her understanding and acceptance.   Dental advisory given  Plan Discussed with: CRNA, Anesthesiologist and Surgeon  Anesthesia Plan Comments:        Anesthesia Quick Evaluation

## 2016-01-16 ENCOUNTER — Inpatient Hospital Stay (HOSPITAL_COMMUNITY)
Admission: RE | Admit: 2016-01-16 | Discharge: 2016-01-21 | DRG: 331 | Disposition: A | Discharge status: Home / Self Care | Payer: BLUE CROSS/BLUE SHIELD | Source: Ambulatory Visit | Attending: General Surgery | Admitting: General Surgery

## 2016-01-16 ENCOUNTER — Encounter (HOSPITAL_COMMUNITY)
Admission: RE | Disposition: A | Discharge status: Home / Self Care | Payer: Self-pay | Source: Ambulatory Visit | Attending: General Surgery

## 2016-01-16 ENCOUNTER — Encounter (HOSPITAL_COMMUNITY): Payer: Self-pay | Admitting: *Deleted

## 2016-01-16 ENCOUNTER — Inpatient Hospital Stay (HOSPITAL_COMMUNITY): Payer: BLUE CROSS/BLUE SHIELD | Admitting: Anesthesiology

## 2016-01-16 DIAGNOSIS — F17200 Nicotine dependence, unspecified, uncomplicated: Secondary | ICD-10-CM | POA: Diagnosis present

## 2016-01-16 DIAGNOSIS — K5732 Diverticulitis of large intestine without perforation or abscess without bleeding: Principal | ICD-10-CM | POA: Diagnosis present

## 2016-01-16 DIAGNOSIS — F419 Anxiety disorder, unspecified: Secondary | ICD-10-CM | POA: Diagnosis present

## 2016-01-16 DIAGNOSIS — R109 Unspecified abdominal pain: Secondary | ICD-10-CM | POA: Diagnosis present

## 2016-01-16 DIAGNOSIS — Z79899 Other long term (current) drug therapy: Secondary | ICD-10-CM

## 2016-01-16 HISTORY — PX: LAPAROSCOPIC PARTIAL COLECTOMY: SHX5907

## 2016-01-16 HISTORY — PX: PROCTOSCOPY: SHX2266

## 2016-01-16 LAB — CBC
HEMATOCRIT: 41.8 % (ref 39.0–52.0)
HEMOGLOBIN: 14.1 g/dL (ref 13.0–17.0)
MCH: 28.7 pg (ref 26.0–34.0)
MCHC: 33.7 g/dL (ref 30.0–36.0)
MCV: 85.1 fL (ref 78.0–100.0)
Platelets: 145 10*3/uL — ABNORMAL LOW (ref 150–400)
RBC: 4.91 MIL/uL (ref 4.22–5.81)
RDW: 14.4 % (ref 11.5–15.5)
WBC: 7.5 10*3/uL (ref 4.0–10.5)

## 2016-01-16 LAB — CREATININE, SERUM
Creatinine, Ser: 0.99 mg/dL (ref 0.61–1.24)
GFR calc Af Amer: >60 mL/min (ref >60)
GFR calc non Af Amer: >60 mL/min (ref >60)

## 2016-01-16 LAB — TYPE AND SCREEN
ABO/RH(D): B POS
Antibody Screen: NEGATIVE

## 2016-01-16 SURGERY — LAPAROSCOPIC PARTIAL COLECTOMY
Anesthesia: General | Laterality: Left

## 2016-01-16 MED ORDER — ONDANSETRON HCL 4 MG PO TABS: 4.0000 mg | ORAL_TABLET | Freq: Four times a day (QID) | ORAL | Status: DC | PRN

## 2016-01-16 MED ORDER — SODIUM CHLORIDE 0.9 % IJ SOLN
INTRAMUSCULAR | Status: AC
  Filled 2016-01-16: qty 10

## 2016-01-16 MED ORDER — 0.9 % SODIUM CHLORIDE (POUR BTL) OPTIME
TOPICAL | Status: DC | PRN
  Administered 2016-01-16: 3000 mL

## 2016-01-16 MED ORDER — MIDAZOLAM HCL 2 MG/2ML IJ SOLN
INTRAMUSCULAR | Status: DC | PRN
  Administered 2016-01-16 (×2): 1 mg via INTRAVENOUS

## 2016-01-16 MED ORDER — EPHEDRINE SULFATE 50 MG/ML IJ SOLN
INTRAMUSCULAR | Status: DC | PRN
  Administered 2016-01-16 (×2): 2.5 mg via INTRAVENOUS

## 2016-01-16 MED ORDER — ONDANSETRON HCL 4 MG/2ML IJ SOLN: 4.0000 mg | Freq: Four times a day (QID) | INTRAMUSCULAR | Status: DC | PRN

## 2016-01-16 MED ORDER — LIP MEDEX EX OINT
TOPICAL_OINTMENT | CUTANEOUS | Status: AC
  Administered 2016-01-16: 1
  Filled 2016-01-16: qty 7

## 2016-01-16 MED ORDER — HYDROMORPHONE HCL 2 MG/ML IJ SOLN
INTRAMUSCULAR | Status: AC
  Filled 2016-01-16: qty 1

## 2016-01-16 MED ORDER — ONDANSETRON HCL 4 MG/2ML IJ SOLN
INTRAMUSCULAR | Status: AC
  Filled 2016-01-16: qty 2

## 2016-01-16 MED ORDER — BUPIVACAINE-EPINEPHRINE 0.5% -1:200000 IJ SOLN
INTRAMUSCULAR | Status: AC
  Filled 2016-01-16: qty 1

## 2016-01-16 MED ORDER — LIDOCAINE HCL (CARDIAC) 20 MG/ML IV SOLN
INTRAVENOUS | Status: DC | PRN
  Administered 2016-01-16: 40 mg via INTRAVENOUS

## 2016-01-16 MED ORDER — HYDROCODONE-ACETAMINOPHEN 5-325 MG PO TABS
1.0000 | ORAL_TABLET | ORAL | Status: DC | PRN
  Administered 2016-01-16 – 2016-01-21 (×15): 2 via ORAL
  Filled 2016-01-16 (×15): qty 2

## 2016-01-16 MED ORDER — BUPIVACAINE-EPINEPHRINE 0.25% -1:200000 IJ SOLN
INTRAMUSCULAR | Status: AC
  Filled 2016-01-16: qty 1

## 2016-01-16 MED ORDER — ROCURONIUM BROMIDE 50 MG/5ML IV SOLN
INTRAVENOUS | Status: AC
  Filled 2016-01-16: qty 1

## 2016-01-16 MED ORDER — LACTATED RINGERS IV SOLN
INTRAVENOUS | Status: DC | PRN
  Administered 2016-01-16 (×3): via INTRAVENOUS

## 2016-01-16 MED ORDER — DEXTROSE 5 % IV SOLN
2.0000 g | Freq: Two times a day (BID) | INTRAVENOUS | Status: AC
  Administered 2016-01-16: 2 g via INTRAVENOUS
  Filled 2016-01-16: qty 2

## 2016-01-16 MED ORDER — ALVIMOPAN 12 MG PO CAPS
12.0000 mg | ORAL_CAPSULE | Freq: Once | ORAL | Status: AC
  Administered 2016-01-16: 12 mg via ORAL
  Filled 2016-01-16: qty 1

## 2016-01-16 MED ORDER — NEOSTIGMINE METHYLSULFATE 10 MG/10ML IV SOLN
INTRAVENOUS | Status: DC | PRN
  Administered 2016-01-16: 150 mg via INTRAVENOUS

## 2016-01-16 MED ORDER — ACETAMINOPHEN 10 MG/ML IV SOLN
1000.0000 mg | Freq: Once | INTRAVENOUS | Status: AC
  Administered 2016-01-16: 1000 mg via INTRAVENOUS

## 2016-01-16 MED ORDER — ROCURONIUM BROMIDE 100 MG/10ML IV SOLN
INTRAVENOUS | Status: DC | PRN
  Administered 2016-01-16: 5 mg via INTRAVENOUS
  Administered 2016-01-16: 10 mg via INTRAVENOUS
  Administered 2016-01-16 (×2): 5 mg via INTRAVENOUS
  Administered 2016-01-16: 40 mg via INTRAVENOUS
  Administered 2016-01-16: 5 mg via INTRAVENOUS

## 2016-01-16 MED ORDER — ENOXAPARIN SODIUM 40 MG/0.4ML ~~LOC~~ SOLN
40.0000 mg | SUBCUTANEOUS | Status: DC
  Administered 2016-01-17 – 2016-01-21 (×5): 40 mg via SUBCUTANEOUS
  Filled 2016-01-16 (×6): qty 0.4

## 2016-01-16 MED ORDER — HYDROMORPHONE HCL 1 MG/ML IJ SOLN
INTRAMUSCULAR | Status: AC
  Filled 2016-01-16: qty 1

## 2016-01-16 MED ORDER — PROPOFOL 10 MG/ML IV BOLUS
INTRAVENOUS | Status: AC
  Filled 2016-01-16: qty 20

## 2016-01-16 MED ORDER — GLYCOPYRROLATE 0.2 MG/ML IJ SOLN
INTRAMUSCULAR | Status: AC
  Filled 2016-01-16: qty 3

## 2016-01-16 MED ORDER — HYDROMORPHONE HCL 1 MG/ML IJ SOLN
0.2500 mg | INTRAMUSCULAR | Status: DC | PRN
  Administered 2016-01-16 (×4): 0.5 mg via INTRAVENOUS

## 2016-01-16 MED ORDER — PHENYLEPHRINE 40 MCG/ML (10ML) SYRINGE FOR IV PUSH (FOR BLOOD PRESSURE SUPPORT)
PREFILLED_SYRINGE | INTRAVENOUS | Status: AC
  Filled 2016-01-16: qty 10

## 2016-01-16 MED ORDER — BUPIVACAINE-EPINEPHRINE (PF) 0.5% -1:200000 IJ SOLN
INTRAMUSCULAR | Status: DC | PRN
  Administered 2016-01-16: 20 mL via PERINEURAL

## 2016-01-16 MED ORDER — MIDAZOLAM HCL 2 MG/2ML IJ SOLN
INTRAMUSCULAR | Status: AC
  Filled 2016-01-16: qty 2

## 2016-01-16 MED ORDER — HYDROMORPHONE HCL 1 MG/ML IJ SOLN
1.0000 mg | INTRAMUSCULAR | Status: DC | PRN
  Administered 2016-01-16 – 2016-01-20 (×17): 1 mg via INTRAVENOUS
  Filled 2016-01-16 (×17): qty 1

## 2016-01-16 MED ORDER — CEFOTETAN DISODIUM-DEXTROSE 2-2.08 GM-% IV SOLR
INTRAVENOUS | Status: AC
  Filled 2016-01-16: qty 50

## 2016-01-16 MED ORDER — NEOSTIGMINE METHYLSULFATE 10 MG/10ML IV SOLN
INTRAVENOUS | Status: AC
  Filled 2016-01-16: qty 1

## 2016-01-16 MED ORDER — LACTATED RINGERS IR SOLN
Status: DC | PRN
  Administered 2016-01-16: 1000 mL

## 2016-01-16 MED ORDER — DEXTROSE 5 % IV SOLN
2.0000 g | INTRAVENOUS | Status: AC
  Administered 2016-01-16: 2 g via INTRAVENOUS

## 2016-01-16 MED ORDER — PHENYLEPHRINE HCL 10 MG/ML IJ SOLN
INTRAMUSCULAR | Status: DC | PRN
  Administered 2016-01-16 (×4): 40 ug via INTRAVENOUS

## 2016-01-16 MED ORDER — SUGAMMADEX SODIUM 200 MG/2ML IV SOLN
INTRAVENOUS | Status: AC
  Filled 2016-01-16: qty 2

## 2016-01-16 MED ORDER — ACETAMINOPHEN 10 MG/ML IV SOLN
INTRAVENOUS | Status: AC
  Filled 2016-01-16: qty 100

## 2016-01-16 MED ORDER — HYDROMORPHONE HCL 1 MG/ML IJ SOLN
INTRAMUSCULAR | Status: DC | PRN
  Administered 2016-01-16: .2 mg via INTRAVENOUS
  Administered 2016-01-16: .5 mg via INTRAVENOUS
  Administered 2016-01-16: .2 mg via INTRAVENOUS
  Administered 2016-01-16: .5 mg via INTRAVENOUS
  Administered 2016-01-16: .6 mg via INTRAVENOUS

## 2016-01-16 MED ORDER — FENTANYL CITRATE (PF) 100 MCG/2ML IJ SOLN
INTRAMUSCULAR | Status: AC
Start: 2016-01-16 — End: 2016-01-16
  Filled 2016-01-16: qty 2

## 2016-01-16 MED ORDER — ALVIMOPAN 12 MG PO CAPS
12.0000 mg | ORAL_CAPSULE | Freq: Two times a day (BID) | ORAL | Status: DC
  Administered 2016-01-17 – 2016-01-20 (×8): 12 mg via ORAL
  Filled 2016-01-16 (×10): qty 1

## 2016-01-16 MED ORDER — LACTATED RINGERS IV SOLN: INTRAVENOUS | Status: DC

## 2016-01-16 MED ORDER — FENTANYL CITRATE (PF) 100 MCG/2ML IJ SOLN
INTRAMUSCULAR | Status: DC | PRN
  Administered 2016-01-16: 100 ug via INTRAVENOUS

## 2016-01-16 MED ORDER — PANTOPRAZOLE SODIUM 40 MG IV SOLR
40.0000 mg | INTRAVENOUS | Status: DC
  Administered 2016-01-16 – 2016-01-20 (×5): 40 mg via INTRAVENOUS
  Filled 2016-01-16 (×5): qty 40

## 2016-01-16 MED ORDER — LIDOCAINE HCL (CARDIAC) 20 MG/ML IV SOLN
INTRAVENOUS | Status: AC
Start: 2016-01-16 — End: 2016-01-16
  Filled 2016-01-16: qty 5

## 2016-01-16 MED ORDER — POTASSIUM CHLORIDE 2 MEQ/ML IV SOLN
INTRAVENOUS | Status: DC
  Administered 2016-01-16 (×2): via INTRAVENOUS
  Filled 2016-01-16 (×5): qty 1000

## 2016-01-16 MED ORDER — FENTANYL CITRATE (PF) 250 MCG/5ML IJ SOLN
INTRAMUSCULAR | Status: DC | PRN
  Administered 2016-01-16: 50 ug via INTRAVENOUS
  Administered 2016-01-16 (×2): 100 ug via INTRAVENOUS

## 2016-01-16 MED ORDER — EPHEDRINE SULFATE 50 MG/ML IJ SOLN
INTRAMUSCULAR | Status: AC
  Filled 2016-01-16: qty 1

## 2016-01-16 MED ORDER — FENTANYL CITRATE (PF) 250 MCG/5ML IJ SOLN
INTRAMUSCULAR | Status: AC
  Filled 2016-01-16: qty 5

## 2016-01-16 MED ORDER — PROPOFOL 10 MG/ML IV BOLUS
INTRAVENOUS | Status: DC | PRN
  Administered 2016-01-16: 30 mg via INTRAVENOUS
  Administered 2016-01-16: 20 mg via INTRAVENOUS
  Administered 2016-01-16: 150 mg via INTRAVENOUS

## 2016-01-16 SURGICAL SUPPLY — 76 items
APPLIER CLIP 5 13 M/L LIGAMAX5 (MISCELLANEOUS)
APPLIER CLIP ROT 10 11.4 M/L (STAPLE)
BLADE EXTENDED COATED 6.5IN (ELECTRODE) IMPLANT
CABLE HIGH FREQUENCY MONO STRZ (ELECTRODE) ×4 IMPLANT
CELLS DAT CNTRL 66122 CELL SVR (MISCELLANEOUS) IMPLANT
CLIP APPLIE 5 13 M/L LIGAMAX5 (MISCELLANEOUS) IMPLANT
CLIP APPLIE ROT 10 11.4 M/L (STAPLE) IMPLANT
COVER SURGICAL LIGHT HANDLE (MISCELLANEOUS) ×8 IMPLANT
DECANTER SPIKE VIAL GLASS SM (MISCELLANEOUS) ×4 IMPLANT
DRAIN CHANNEL 19F RND (DRAIN) ×4 IMPLANT
DRAPE LAPAROSCOPIC ABDOMINAL (DRAPES) ×4 IMPLANT
DRAPE UTILITY XL STRL (DRAPES) IMPLANT
DRSG OPSITE POSTOP 4X10 (GAUZE/BANDAGES/DRESSINGS) IMPLANT
DRSG OPSITE POSTOP 4X6 (GAUZE/BANDAGES/DRESSINGS) ×4 IMPLANT
DRSG OPSITE POSTOP 4X8 (GAUZE/BANDAGES/DRESSINGS) IMPLANT
DRSG TEGADERM 2-3/8X2-3/4 SM (GAUZE/BANDAGES/DRESSINGS) ×4 IMPLANT
DRSG TEGADERM 4X4.75 (GAUZE/BANDAGES/DRESSINGS) ×4 IMPLANT
ELECT PENCIL ROCKER SW 15FT (MISCELLANEOUS) IMPLANT
ELECT REM PT RETURN 9FT ADLT (ELECTROSURGICAL) ×4
ELECTRODE REM PT RTRN 9FT ADLT (ELECTROSURGICAL) ×2 IMPLANT
EVACUATOR SILICONE 100CC (DRAIN) ×4 IMPLANT
GAUZE SPONGE 2X2 8PLY STRL LF (GAUZE/BANDAGES/DRESSINGS) ×2 IMPLANT
GAUZE SPONGE 4X4 12PLY STRL (GAUZE/BANDAGES/DRESSINGS) IMPLANT
GLOVE BIOGEL PI IND STRL 7.0 (GLOVE) ×20 IMPLANT
GLOVE BIOGEL PI INDICATOR 7.0 (GLOVE) ×20
GLOVE ECLIPSE 8.0 STRL XLNG CF (GLOVE) ×4 IMPLANT
GLOVE EUDERMIC 7 POWDERFREE (GLOVE) ×8 IMPLANT
GLOVE INDICATOR 8.0 STRL GRN (GLOVE) ×4 IMPLANT
GOWN STRL REUS W/TWL XL LVL3 (GOWN DISPOSABLE) ×32 IMPLANT
HOLDER FOLEY CATH W/STRAP (MISCELLANEOUS) ×4 IMPLANT
LEGGING LITHOTOMY PAIR STRL (DRAPES) ×4 IMPLANT
LIGASURE IMPACT 36 18CM CVD LR (INSTRUMENTS) IMPLANT
PACK COLON (CUSTOM PROCEDURE TRAY) ×4 IMPLANT
PAD POSITIONING PINK XL (MISCELLANEOUS) ×4 IMPLANT
PAD TELFA 2X3 NADH STRL (GAUZE/BANDAGES/DRESSINGS) ×4 IMPLANT
POSITIONER SURGICAL ARM (MISCELLANEOUS) ×8 IMPLANT
RTRCTR WOUND ALEXIS 18CM MED (MISCELLANEOUS)
SCISSORS LAP 5X35 DISP (ENDOMECHANICALS) ×4 IMPLANT
SET IRRIG TUBING LAPAROSCOPIC (IRRIGATION / IRRIGATOR) ×4 IMPLANT
SHEARS HARMONIC ACE PLUS 36CM (ENDOMECHANICALS) ×4 IMPLANT
SLEEVE ADV FIXATION 5X100MM (TROCAR) IMPLANT
SLEEVE XCEL OPT CAN 5 100 (ENDOMECHANICALS) IMPLANT
SPONGE GAUZE 2X2 STER 10/PKG (GAUZE/BANDAGES/DRESSINGS) ×2
SPONGE LAP 18X18 X RAY DECT (DISPOSABLE) ×4 IMPLANT
STAPLER CIRC CVD 29MM 37CM (STAPLE) ×4 IMPLANT
STAPLER CUT CVD 40MM BLUE (STAPLE) ×4 IMPLANT
STAPLER PROXIMATE 75MM BLUE (STAPLE) ×4 IMPLANT
STAPLER VISISTAT 35W (STAPLE) ×4 IMPLANT
SUT ETHILON 3 0 PS 1 (SUTURE) ×4 IMPLANT
SUT PDS AB 1 CTX 36 (SUTURE) ×8 IMPLANT
SUT PDS AB 1 TP1 96 (SUTURE) IMPLANT
SUT PROLENE 0 SH 30 (SUTURE) ×8 IMPLANT
SUT PROLENE 2 0 KS (SUTURE) IMPLANT
SUT PROLENE 2 0 SH DA (SUTURE) ×4 IMPLANT
SUT SILK 2 0 (SUTURE) ×2
SUT SILK 2 0 SH CR/8 (SUTURE) ×4 IMPLANT
SUT SILK 2-0 18XBRD TIE 12 (SUTURE) ×2 IMPLANT
SUT SILK 3 0 (SUTURE) ×2
SUT SILK 3 0 SH CR/8 (SUTURE) ×4 IMPLANT
SUT SILK 3-0 18XBRD TIE 12 (SUTURE) ×2 IMPLANT
SUT VIC AB 2-0 SH 18 (SUTURE) IMPLANT
SUT VICRYL 2 0 18  UND BR (SUTURE)
SUT VICRYL 2 0 18 UND BR (SUTURE) IMPLANT
SYS LAPSCP GELPORT 120MM (MISCELLANEOUS)
SYSTEM LAPSCP GELPORT 120MM (MISCELLANEOUS) IMPLANT
TAPE CLOTH 4X10 WHT NS (GAUZE/BANDAGES/DRESSINGS) ×4 IMPLANT
TOWEL OR 17X26 10 PK STRL BLUE (TOWEL DISPOSABLE) IMPLANT
TOWEL OR NON WOVEN STRL DISP B (DISPOSABLE) ×4 IMPLANT
TRAY FOLEY W/METER SILVER 14FR (SET/KITS/TRAYS/PACK) IMPLANT
TRAY FOLEY W/METER SILVER 16FR (SET/KITS/TRAYS/PACK) ×4 IMPLANT
TROCAR ADV FIXATION 5X100MM (TROCAR) IMPLANT
TROCAR BALLN 12MMX100 BLUNT (TROCAR) IMPLANT
TROCAR BLADELESS OPT 5 100 (ENDOMECHANICALS) ×4 IMPLANT
TROCAR XCEL BLUNT TIP 100MML (ENDOMECHANICALS) ×4 IMPLANT
TROCAR XCEL NON-BLD 11X100MML (ENDOMECHANICALS) IMPLANT
TUBING INSUF HEATED (TUBING) ×4 IMPLANT

## 2016-01-16 NOTE — Anesthesia Postprocedure Evaluation (Signed)
Anesthesia Post Note  Patient: STONY LIPPER  Procedure(s) Performed: Procedure(s) (LRB): LAPAROSCOPIC LEFT  COLECTOMY WITH SLENIC FLEXURE MOBILIZATION (Left) PROCTOSCOPY  Patient location during evaluation: PACU Anesthesia Type: General Level of consciousness: sedated Pain management: pain level controlled Vital Signs Assessment: post-procedure vital signs reviewed and stable Respiratory status: spontaneous breathing and respiratory function stable Cardiovascular status: stable Anesthetic complications: no    Last Vitals:  Filed Vitals:   01/16/16 1200 01/16/16 1214  BP: 140/80 127/79  Pulse: 89 89  Temp:  36.7 C  Resp: 9 16    Last Pain:  Filed Vitals:   01/16/16 1215  PainSc: 5                  Chyann Ambrocio DANIEL

## 2016-01-16 NOTE — Anesthesia Procedure Notes (Signed)
Procedure Name: Intubation Date/Time: 01/16/2016 7:28 AM Performed by: Cynda Familia Pre-anesthesia Checklist: Patient identified, Emergency Drugs available, Suction available and Patient being monitored Patient Re-evaluated:Patient Re-evaluated prior to inductionOxygen Delivery Method: Circle System Utilized Preoxygenation: Pre-oxygenation with 100% oxygen Intubation Type: IV induction Ventilation: Mask ventilation without difficulty Laryngoscope Size: Miller and 2 Tube type: Oral Tube size: 7.5 mm Number of attempts: 1 Airway Equipment and Method: Stylet Placement Confirmation: ETT inserted through vocal cords under direct vision,  positive ETCO2 and breath sounds checked- equal and bilateral Secured at: 22 cm Tube secured with: Tape Dental Injury: Teeth and Oropharynx as per pre-operative assessment  Comments: Smooth IV induction- good mask-  IV Roc--  Intubation AM CRNA- atraumatic-  Many missing teeth and chipped teeth- mouth and teeth as preop- bilat BS Lucina Mellow present for induction/intubation

## 2016-01-16 NOTE — Interval H&P Note (Signed)
History and Physical Interval Note:  01/16/2016 6:45 AM  Samuel Sanford  has presented today for surgery, with the diagnosis of Diverticulitis  The various methods of treatment have been discussed with the patient and family. After consideration of risks, benefits and other options for treatment, the patient has consented to  Procedure(s): LAPAROSCOPIC LEFT  COLECTOMY (Left) as a surgical intervention .  The patient's history has been reviewed, patient examined, no change in status, stable for surgery.  I have reviewed the patient's chart and labs.  Questions were answered to the patient's satisfaction.     [name]

## 2016-01-16 NOTE — Op Note (Signed)
Patient Name:           Samuel Sanford   Date of Surgery:        01/16/2016  Pre op Diagnosis:      Diverticulitis  Post op Diagnosis:    Diverticulitis  Procedure:                 Laparoscopic assisted low anterior resection with coloproctostomy                                      Laparoscopic takedown of splenic flexure, extensive  Surgeon:                     Edsel Petrin. Dalbert Batman, M.D., FACS  Assistant:                      Jackolyn Confer, M.D., FACS  Operative Indications:  . This is a very pleasant 65 year old gentleman who returns for preoperative counseling regarding his diverticulitis. Dr. Christella Noa is his PCP. Dr. Scarlette Shorts is his gastroenterologist.  He is motivated to have elective left colectomy for diverticulitis. He's had no pain since I last saw him. He is having daily solid bowel movements. Sees no blood. Last colonoscopy was by Dr. Scarlette Shorts on November 03, 2012 and showed distal descending and sigmoid diverticulosis. His first episode of diverticulitis as was 65 in [place]. The CT scan then showed no complication. He was asymptomatic for 2 years and then in September 2016 he was hospitalized in [place] for 4 days with acute abdominal pain. He says this was very severe. CT scan showed some inflammatory changes at the junction between the distal descending and sigmoid colon and a tiny amount of extraluminal air, possibly a tiny amount of extraluminal contrast. He responded very quickly and was discharged home on Cipro and Flagyl and has basically been asymptomatic for the past 8 months.      He had a friend that had open perforation with peritonitis requiring colostomy after the third episode and does not want to have that happen to him.      A barium enema was performed on November 25, 2015. This shows moderate to severe diverticulosis of the distal descending and sigmoid colon, maximal in the sigmoid. There was a slight contour deformity of the mid  descending colon and the radiologist theorized that this was at the perforation site in September and that this might be scarring. There was no stricture or fistula. The rest of the colon looked fine. The rectum looked fine.     I discussed all of these issues with him. He was to go ahead with surgery in May. He will be scheduled for laparoscopic assisted left colectomy, takedown splenic flexure, possible open colectomy, possible temporary diverting loop ileostomy.tands all these issues well. He underwent a mechanical and antibiotic bowel prep yesterday and that went well.  Operative Findings:       There was extensive chronic diverticular changes in the sigmoid colon and in the distal descending colon.  The resection had to be taken up to the distal to mid descending to get to clean soft bowel without diverticula.  The anastomosis was created in the mid rectum above the peritoneal reflection with a 29 mm EEA stapler.  Because of the proximal extent of resection I had to extensively mobilized the splenic flexure.  Following the  EEA stapling there were 2 full-thickness complete doughnuts.  Proctoscopy and insufflation under area revealed no air leak.  Procedure in Detail:          Following the induction of general endotracheal anesthesia and oral gastric tube was placed, a Foley catheter was placed, the patient was placed in padded rigid stirrups in lithotomy position.  The abdomen and perineum were prepped and draped in a sterile fashion.  Surgical timeout was performed.  Intravenous antibiotics were given.  0.5% Marcaine with epinephrine was used as a local infiltration anesthetic.      An 11 mm Hassan trocar was placed in the midline at the superior umbilical rim.  This was open technique.  Pneumoperitoneum was created.  A 5 mm trocar was placed in the suprapubic area, and 5 mm trochars in the left midabdomen and right midabdomen.  We mobilized the sigmoid colon from lateral to medial.  We identified  the left ureter and preserved that.  We took the peritoneal dissection all the way down over the pelvic brim.  We did incise the peritoneum over the sigmoid mesentery on the right side all the way down to the peritoneal brim.  We then mobilized the descending colon extensively.  We took down the splenic flexure.  This took a long time but was uneventful.  We dissected the omentum off of the splenic flexure and left transverse colon.  We got into the lesser sac we did this in several steps until we had complete mobilization to where we could do the anastomosis without tension.      A lower midline incision was made about 8-10 cm in length.  Self retaining retractor was placed.  The colon was exteriorized.  I created a window in the mesentery of the mid rectum and then divided the mid rectum with a contour stapler with a blue load.  The mesentery of the sigmoid colon and distal descending were divided with the LigaSure device, staying close to the colon wall.  We transected the descending colon with the GIA stapling device.  A suture was placed in the distal end of the surgical specimen and it was sent to the lab.  We brought the distal descending colon down and elevated it with  Allis clamps and amputated the staple line.  We were able to fairly easily put a 29 mm EEA sizer in this.  Pursestring suture of 0 Prolene was created.  The anvil of the 29 mm in the stapler was inserted and the pursestring suture tied.  This looked secure.  We debrided some of the fat to expose the serosa for the anastomosis.  We had about 5 or 6 cm overlap between the proximal distal ends and so there was no tension.  There was good blood supply with excellent bleeding.       Dr. Zella Richer went down to the perineal approach and after some dilatation passed the 29 mm EEA stapler up into the rectal stump.  I positioned this and he opened the stapler so that the spike came out on the anterior wall.  The proximal colon and anvil were then  secured to the spike of the stapler.  The stapler was closed, fired, opened and removed.  There was no bleeding.  He we had two excellent complete doughnuts of tissue.  Proctoscopy revealed no bleeding and insufflation under  under water revealed no air bubbles.    At this point we changed our gowns gloves instruments and drapes.  We irrigated  out the pelvis.  The did not seem to be any collections of blood anywhere.  We could bring the omentum down under the wound.  The midline fascia and peritoneum were closed in a single layer with running suture of #1 PDS.  We then reinsufflated and made a 4 quadrant inspection and there was almost no blood.  A 19 [place]  Blake drain was placed into the pelvis and brought out through the trocar site on the right side sutured to the skin and connected to a suction bulb.  After irrigating,  all the wounds were closed the skin with skin staples and I placed Telfa wicks in between the staples of the midline wound.  Protocol honeycomb bandage was placed.  The patient tolerated the procedure well was taken to PACU in stable condition.  EBL 1 50 mL or less.  Counts correct.  Complications none.     Edsel Petrin. Dalbert Batman, M.D., FACS General and Minimally Invasive Surgery Breast and Colorectal Surgery  01/16/2016 11:11 AM

## 2016-01-16 NOTE — Transfer of Care (Signed)
Immediate Anesthesia Transfer of Care Note  Patient: Samuel Sanford  Procedure(s) Performed: Procedure(s): LAPAROSCOPIC LEFT  COLECTOMY WITH SLENIC FLEXURE MOBILIZATION (Left) PROCTOSCOPY  Patient Location: PACU  Anesthesia Type:General  Level of Consciousness: awake and patient cooperative  Airway & Oxygen Therapy: Patient Spontanous Breathing and Patient connected to face mask oxygen  Post-op Assessment: Report given to RN, Post -op Vital signs reviewed and stable and moaning c/o pain.  Post vital signs: Reviewed and stable  Last Vitals:  Filed Vitals:   01/16/16 0544  BP: 140/83  Pulse: 62  Temp: 36.9 C  Resp: 18    Last Pain: There were no vitals filed for this visit.       Complications: No apparent anesthesia complications

## 2016-01-17 LAB — CBC
HEMATOCRIT: 38.5 % — AB (ref 39.0–52.0)
HEMOGLOBIN: 13 g/dL (ref 13.0–17.0)
MCH: 28.6 pg (ref 26.0–34.0)
MCHC: 33.8 g/dL (ref 30.0–36.0)
MCV: 84.6 fL (ref 78.0–100.0)
Platelets: 125 10*3/uL — ABNORMAL LOW (ref 150–400)
RBC: 4.55 MIL/uL (ref 4.22–5.81)
RDW: 14.4 % (ref 11.5–15.5)
WBC: 7.2 10*3/uL (ref 4.0–10.5)

## 2016-01-17 LAB — BASIC METABOLIC PANEL
ANION GAP: 5 (ref 5–15)
BUN: 10 mg/dL (ref 6–20)
CO2: 30 mmol/L (ref 22–32)
Calcium: 8.5 mg/dL — ABNORMAL LOW (ref 8.9–10.3)
Chloride: 104 mmol/L (ref 101–111)
Creatinine, Ser: 1.01 mg/dL (ref 0.61–1.24)
GLUCOSE: 101 mg/dL — AB (ref 65–99)
POTASSIUM: 3.7 mmol/L (ref 3.5–5.1)
Sodium: 139 mmol/L (ref 135–145)

## 2016-01-17 MED ORDER — POTASSIUM CHLORIDE 2 MEQ/ML IV SOLN
INTRAVENOUS | Status: DC
  Administered 2016-01-17 – 2016-01-20 (×5): via INTRAVENOUS
  Filled 2016-01-17 (×12): qty 1000

## 2016-01-17 NOTE — Progress Notes (Signed)
1 Day Post-Op  Subjective: Doing well.  Stable and alert.  Ambulated in hall last night.  No respiratory symptoms.  No nausea.  Tolerating ice chips. Hemoglobin 13.0.  WBC 7200.  Potassium 3.7.  Creatinine 1.01.  Objective: Vital signs in last 24 hours: Temp:  [97.9 F (36.6 C)-100 F (37.8 C)] 100 F (37.8 C) (05/19 0557) Pulse Rate:  [74-102] 74 (05/19 0557) Resp:  [9-16] 16 (05/19 0557) BP: (126-142)/(69-98) 135/69 mmHg (05/19 0557) SpO2:  [95 %-100 %] 95 % (05/19 0557) Last BM Date: 01/15/16  Intake/Output from previous day: 05/18 0701 - 05/19 0700 In: 6112.9 [P.O.:840; I.V.:5122.9; IV Piggyback:150] Out: 1850 [Urine:1430; Drains:270; Blood:150] Intake/Output this shift: Total I/O In: 2862.9 [P.O.:840; I.V.:2022.9] Out: 950 [Urine:800; Drains:150]  General appearance: Alert.  Mental status normal.  Cooperative.  Good spirits.  Mild distress from incisional pain. Resp: clear to auscultation bilaterally GI: Soft.  Nondistended.  Appropriately tender.  JP drainage serosanguineous.  Occasional bowel sounds.  Lab Results:  Results for orders placed or performed during the hospital encounter of 01/16/16 (from the past 24 hour(s))  CBC     Status: Abnormal   Collection Time: 01/16/16 12:33 PM  Result Value Ref Range   WBC 7.5 4.0 - 10.5 K/uL   RBC 4.91 4.22 - 5.81 MIL/uL   Hemoglobin 14.1 13.0 - 17.0 g/dL   HCT 41.8 39.0 - 52.0 %   MCV 85.1 78.0 - 100.0 fL   MCH 28.7 26.0 - 34.0 pg   MCHC 33.7 30.0 - 36.0 g/dL   RDW 14.4 11.5 - 15.5 %   Platelets 145 (L) 150 - 400 K/uL  Creatinine, serum     Status: None   Collection Time: 01/16/16 12:33 PM  Result Value Ref Range   Creatinine, Ser 0.99 0.61 - 1.24 mg/dL   GFR calc non Af Amer >60 >60 mL/min   GFR calc Af Amer >60 >60 mL/min  Basic metabolic panel     Status: Abnormal   Collection Time: 01/17/16  4:17 AM  Result Value Ref Range   Sodium 139 135 - 145 mmol/L   Potassium 3.7 3.5 - 5.1 mmol/L   Chloride 104 101 -  111 mmol/L   CO2 30 22 - 32 mmol/L   Glucose, Bld 101 (H) 65 - 99 mg/dL   BUN 10 6 - 20 mg/dL   Creatinine, Ser 1.01 0.61 - 1.24 mg/dL   Calcium 8.5 (L) 8.9 - 10.3 mg/dL   GFR calc non Af Amer >60 >60 mL/min   GFR calc Af Amer >60 >60 mL/min   Anion gap 5 5 - 15  CBC     Status: Abnormal   Collection Time: 01/17/16  4:17 AM  Result Value Ref Range   WBC 7.2 4.0 - 10.5 K/uL   RBC 4.55 4.22 - 5.81 MIL/uL   Hemoglobin 13.0 13.0 - 17.0 g/dL   HCT 38.5 (L) 39.0 - 52.0 %   MCV 84.6 78.0 - 100.0 fL   MCH 28.6 26.0 - 34.0 pg   MCHC 33.8 30.0 - 36.0 g/dL   RDW 14.4 11.5 - 15.5 %   Platelets 125 (L) 150 - 400 K/uL     Studies/Results: No results found.  Marland Kitchen alvimopan  12 mg Oral BID  . enoxaparin (LOVENOX) injection  40 mg Subcutaneous Q24H  . pantoprazole (PROTONIX) IV  40 mg Intravenous Q24H     Assessment/Plan: s/p Procedure(s): LAPAROSCOPIC LEFT  COLECTOMY WITH SLENIC FLEXURE MOBILIZATION PROCTOSCOPY  POD #1 laparoscopic-assisted  left colectomy with takedown splenic flexure for diverticulitis Stable Clear liquids Entereg Lovenox for DVT prophylaxis Ambulate Foley out tomorrow Remove wicks Sunday or Monday. Leave drain over weekend  @PROBHOSP @  LOS: 1 day    Timisha Mondry M 01/17/2016  .

## 2016-01-18 NOTE — Progress Notes (Signed)
Patient ID: BESSIE VALDIVIA, male   DOB: 16-Sep-1950, 65 y.o.   MRN: [id number] 2 Days Post-Op  Subjective: He feels well. Pain well controlled with occasional meds. Passing flatus. No nausea. He would like to advance his diet. Walking in the halls.  Objective: Vital signs in last 24 hours: Temp:  [98.9 F (37.2 C)-99.5 F (37.5 C)] 98.9 F (37.2 C) (05/20 0449) Pulse Rate:  [70-82] 71 (05/20 0449) Resp:  [16] 16 (05/20 0449) BP: (135-139)/(74-90) 139/74 mmHg (05/20 0449) SpO2:  [95 %-98 %] 95 % (05/20 0449) Last BM Date: 01/15/16  Intake/Output from previous day: 05/19 0701 - 05/20 0700 In: 2954.8 [P.O.:1320; I.V.:1634.8] Out: 4890 [Urine:4750; Drains:140] Intake/Output this shift:    General appearance: alert, cooperative and no distress GI: mild appropriate tenderness. Nondistended. Incision/Wound: dressing cleaned with slight old bloody drainage. No erythema. JP drainage minimal and serous sanguinous  Lab Results:   Recent Labs  01/16/16 1233 01/17/16 0417  WBC 7.5 7.2  HGB 14.1 13.0  HCT 41.8 38.5*  PLT 145* 125*   BMET  Recent Labs  01/16/16 1233 01/17/16 0417  NA  --  139  K  --  3.7  CL  --  104  CO2  --  30  GLUCOSE  --  101*  BUN  --  10  CREATININE 0.99 1.01  CALCIUM  --  8.5*     Studies/Results: No results found.  Anti-infectives: Anti-infectives    Start     Dose/Rate Route Frequency Ordered Stop   01/16/16 1930  cefoTEtan (CEFOTAN) 2 g in dextrose 5 % 50 mL IVPB     2 g 100 mL/hr over 30 Minutes Intravenous Every 12 hours 01/16/16 1220 01/16/16 1830   01/16/16 0550  cefoTEtan (CEFOTAN) 2 g in dextrose 5 % 50 mL IVPB     2 g 100 mL/hr over 30 Minutes Intravenous On call to O.R. 01/16/16 0550 01/16/16 0800      Assessment/Plan: s/p Procedure(s): LAPAROSCOPIC LEFT  COLECTOMY WITH SLENIC FLEXURE MOBILIZATION PROCTOSCOPY Doing well without apparent complication. Advanced to full liquid diet.   LOS: 2 days    Ronneisha Jett  T 01/18/2016

## 2016-01-19 NOTE — Progress Notes (Signed)
Patient ID: Samuel Sanford, male   DOB: [birth year], 65 y.o.   MRN: [id number] 3 Days Post-Op  Subjective: Doing well. Denies anypain or soreness. No nausea on full liquids. He has had flatus but no bowel movements.  Objective: Vital signs in last 24 hours: Temp:  [98.1 F (36.7 C)-98.9 F (37.2 C)] 98.9 F (37.2 C) (05/21 0443) Pulse Rate:  [68-80] 68 (05/21 0443) Resp:  [16-20] 16 (05/21 0443) BP: (142-149)/(82-87) 149/87 mmHg (05/21 0443) SpO2:  [95 %-98 %] 98 % (05/21 0443) Last BM Date: 01/15/16  Intake/Output from previous day: 05/20 0701 - 05/21 0700 In: 2700 [P.O.:1680; I.V.:1020] Out: 2410 [Urine:2375; Drains:35] Intake/Output this shift: Total I/O In: -  Out: 450 [Urine:450]  General appearance: alert, cooperative and no distress GI: normal findings: soft, non-tender and minimal distention Incision/Wound: dressings clean and dry. JP drain serosanguineous  Lab Results:   Recent Labs  01/16/16 1233 01/17/16 0417  WBC 7.5 7.2  HGB 14.1 13.0  HCT 41.8 38.5*  PLT 145* 125*   BMET  Recent Labs  01/16/16 1233 01/17/16 0417  NA  --  139  K  --  3.7  CL  --  104  CO2  --  30  GLUCOSE  --  101*  BUN  --  10  CREATININE 0.99 1.01  CALCIUM  --  8.5*     Studies/Results: No results found.  Anti-infectives: Anti-infectives    Start     Dose/Rate Route Frequency Ordered Stop   01/16/16 1930  cefoTEtan (CEFOTAN) 2 g in dextrose 5 % 50 mL IVPB     2 g 100 mL/hr over 30 Minutes Intravenous Every 12 hours 01/16/16 1220 01/16/16 1830   01/16/16 0550  cefoTEtan (CEFOTAN) 2 g in dextrose 5 % 50 mL IVPB     2 g 100 mL/hr over 30 Minutes Intravenous On call to O.R. 01/16/16 0550 01/16/16 0800      Assessment/Plan: s/p Procedure(s): LAPAROSCOPIC LEFT  COLECTOMY WITH SLENIC FLEXURE MOBILIZATION PROCTOSCOPY Doing well without apparent complication. He is ambulating frequently. No bowel function yet. Continue full liquid diet today.   LOS: 3 days     Naje Rice T 01/19/2016

## 2016-01-20 LAB — BASIC METABOLIC PANEL
Anion gap: 7 (ref 5–15)
BUN: 8 mg/dL (ref 6–20)
CO2: 26 mmol/L (ref 22–32)
CREATININE: 0.96 mg/dL (ref 0.61–1.24)
Calcium: 9 mg/dL (ref 8.9–10.3)
Chloride: 104 mmol/L (ref 101–111)
GFR calc Af Amer: >60 mL/min (ref >60)
GLUCOSE: 96 mg/dL (ref 65–99)
Potassium: 3.9 mmol/L (ref 3.5–5.1)
SODIUM: 137 mmol/L (ref 135–145)

## 2016-01-20 LAB — CBC
HCT: 38.5 % — ABNORMAL LOW (ref 39.0–52.0)
Hemoglobin: 13.2 g/dL (ref 13.0–17.0)
MCH: 29 pg (ref 26.0–34.0)
MCHC: 34.3 g/dL (ref 30.0–36.0)
MCV: 84.6 fL (ref 78.0–100.0)
PLATELETS: 196 10*3/uL (ref 150–400)
RBC: 4.55 MIL/uL (ref 4.22–5.81)
RDW: 14.2 % (ref 11.5–15.5)
WBC: 5.2 10*3/uL (ref 4.0–10.5)

## 2016-01-20 NOTE — Progress Notes (Signed)
4 Days Post-Op  Subjective: Lots of flatus. One tiny bowel movement Tolerating full liquids.  No nausea.  Voiding uneventfully.  Good pain control. Afebrile and stable. JP drainage serosanguineous  Pathology shows diverticulitis. Objective: Vital signs in last 24 hours: Temp:  [98.1 F (36.7 C)-98.6 F (37 C)] 98.1 F (36.7 C) (05/21 2145) Pulse Rate:  [66-79] 66 (05/21 2145) Resp:  [16] 16 (05/21 2145) BP: (134-146)/(87-93) 146/93 mmHg (05/21 2145) SpO2:  [98 %-99 %] 99 % (05/21 2145) Last BM Date: 01/19/16  Intake/Output from previous day: 05/21 0701 - 05/22 0700 In: 716 [P.O.:36; I.V.:680] Out: 2560 [Urine:2500; Drains:60] Intake/Output this shift: Total I/O In: 680 [I.V.:680] Out: 425 [Urine:400; Drains:25]  General appearance: Alert.  Stable.  No distress.  Very talkative.  Slightly anxious, baseline. Resp: clear to auscultation bilaterally GI: Soft.  Nondistended.  Active bowel sounds.  Wounds look good.  Drainage is serosanguineous.  Fairly benign postop exam.  Lab Results:  No results found for this or any previous visit (from the past 24 hour(s)).   Studies/Results: No results found.  Marland Kitchen alvimopan  12 mg Oral BID  . enoxaparin (LOVENOX) injection  40 mg Subcutaneous Q24H  . pantoprazole (PROTONIX) IV  40 mg Intravenous Q24H     Assessment/Plan: s/p Procedure(s): LAPAROSCOPIC LEFT  COLECTOMY WITH SLENIC FLEXURE MOBILIZATION PROCTOSCOPY   POD#4 -doing well without apparent complications Soft diet Change dressing today-remove wicks. Home tomorrow if he opens up and has better bowel movements.  @PROBHOSP @  LOS: 4 days    Harjot Dibello M 01/20/2016  . .prob

## 2016-01-21 MED ORDER — HYDROCODONE-ACETAMINOPHEN 5-325 MG PO TABS: 1.0000 | ORAL_TABLET | ORAL | Status: AC | PRN

## 2016-01-21 NOTE — Discharge Summary (Signed)
Patient ID: KHAOS LADUCER [id number] 65 y.o. 10/14/1950  Admit date: 01/16/2016  Discharge date and time: 01/21/2016  Admitting Physician: [name]  Discharge Physician: [name]  Admission Diagnoses: Diverticulitis  Discharge Diagnoses: Diverticulitis of large intestine  Operations: Procedure(s): LAPAROSCOPIC LEFT  COLECTOMY WITH SLENIC FLEXURE MOBILIZATION PROCTOSCOPY  Admission Condition: good  Discharged Condition: good .l. Indication for Admission: This is a very pleasant 65 year old gentleman with recurrent sigmoid diverticulitis who is admitted electively for laparoscopic left colectomy Dr. Christella Noa is his PCP. Dr. Scarlette Shorts is his gastroenterologist.      He is motivated to have elective left colectomy for diverticulitis.  Last colonoscopy was by Dr. Scarlette Shorts on November 03, 2012 and showed distal descending and sigmoid diverticulosis. His first episode of diverticulitis as was 14 in [place]. The CT scan then showed no complication. He was asymptomatic for 2 years and then in September 2016 he was hospitalized in [place] for 4 days with acute abdominal pain. He says this was very severe. CT scan showed some inflammatory changes at the junction between the distal descending and sigmoid colon and a tiny amount of extraluminal air, possibly a tiny amount of extraluminal contrast. He responded very quickly and was discharged home on Cipro and Flagyl and has basically been asymptomatic for the past 8 months.  He had a friend that had open perforation with peritonitis requiring colostomy after the third episode and does not want to have that happen to him.  A barium enema was performed on November 25, 2015. This shows moderate to severe diverticulosis of the distal descending and sigmoid colon, maximal in the sigmoid. There was a slight contour deformity of the mid descending colon and the radiologist theorized that this was at the  perforation site in September and that this might be scarring. There was no stricture or fistula. The rest of the colon looked fine. The rectum looked fine.  I discussed all of these issues with him. He wants to go ahead with surgery in May. He will be scheduled for laparoscopic assisted left colectomy, takedown splenic flexure, possible open colectomy, possible temporary diverting loop ileostomy.tands all these issues well. He underwent a mechanical and antibiotic bowel prep yesterday and that went well.  Hospital Course: On the day of admission the patient was taken to the operating room and underwent laparoscopic-assisted left colectomy with takedown of splenic flexure.  Operative findings were  extensive chronic diverticular changes in the sigmoid colon and in the distal descending colon. The resection had to be taken up to the distal to mid descending to get to clean soft bowel without diverticula. The anastomosis was created in the mid rectum above the peritoneal reflection with a 29 mm EEA stapler. Because of the proximal extent of resection I had to extensively mobilized the splenic flexure. Following the EEA stapling there were 2 full-thickness complete doughnuts. Proctoscopy and insufflation under area revealed no air leak.     Postoperatively the patient progressed well and without apparent complications.  He became ambulatory on postoperative day 1.  Foley catheter removed on postoperative day 2 and had no trouble voiding.  His diet was advanced step wise all the way up to soft diet which he tolerated for 24 hours prior to discharge.  He began having bowel movements 36 hours prior to discharge and had about 3 bowel movements and felt much better.  On the day of discharge his abdomen was soft.  Nondistended.  Wounds looked good.  JP drainage  was clear serosanguineous and the drain was removed.     He was ready to go home.  Diet and activities were discussed.  Continue usual medicines.   Prescription for Norco was given but he was advised to switch over to Tylenol as soon as he could.  Final pathology report showed diverticulosis.  This was discussed with him.  He will return to my office in one week for staple removal.  Consults: None  Significant Diagnostic Studies: Surgical pathology  Treatments: surgery: Laparoscopic-assisted left colectomy with takedown of splenic flexure  Disposition: Home  Patient Instructions:    Medication List    TAKE these medications        CALCIUM CARBONATE PO  Take 1 tablet by mouth daily.     Coenzyme Q10 100 MG Tabs  Take 100 mg by mouth daily.     Fish Oil 1200 MG Caps  Take 1,200 mg by mouth daily.     HYDROcodone-acetaminophen 5-325 MG tablet  Commonly known as:  NORCO/VICODIN  Take 1-2 tablets by mouth every 4 (four) hours as needed for moderate pain.     metroNIDAZOLE 500 MG tablet  Commonly known as:  FLAGYL  Take 1,000 mg by mouth See admin instructions. Take at 2pm, 3pm and 10pm the day prior to surgery.     neomycin 500 MG tablet  Commonly known as:  MYCIFRADIN  Take 1,000 mg by mouth See admin instructions. Take at 2pm, 3pm and 10pm the day prior to procedure.     OVER THE COUNTER MEDICATION  Take 1,000 mg by mouth daily. Cinnamon 1000mg      sildenafil 100 MG tablet  Commonly known as:  VIAGRA  Take 100 mg by mouth daily as needed for erectile dysfunction.     SYSTANE ULTRA OP  Place 2 drops into both eyes daily as needed (For dry eyes.).     VITAMIN D3 PO  Take 1 tablet by mouth daily.        Activity: Frequent ambulation advised.  No sports.  No lifting more than 20 pounds.  No driving for 2 weeks. Diet: low fat, low cholesterol diet Wound Care: keep wound clean and dry  Follow-up:  With Dr. Dalbert Batman in 1 week.  Signed: Edsel Petrin. Dalbert Batman, M.D., FACS General and minimally invasive surgery Breast and Colorectal Surgery  01/21/2016, 6:12 AM

## 2016-01-21 NOTE — Discharge Instructions (Signed)
See above

## 2016-04-14 ENCOUNTER — Encounter (HOSPITAL_COMMUNITY): Payer: Self-pay

## 2016-07-29 ENCOUNTER — Emergency Department (HOSPITAL_COMMUNITY): Payer: BLUE CROSS/BLUE SHIELD

## 2016-07-29 ENCOUNTER — Encounter (HOSPITAL_COMMUNITY): Payer: Self-pay | Admitting: Emergency Medicine

## 2016-07-29 ENCOUNTER — Emergency Department (HOSPITAL_COMMUNITY)
Admission: EM | Admit: 2016-07-29 | Discharge: 2016-07-29 | Disposition: A | Discharge status: Home / Self Care | Payer: BLUE CROSS/BLUE SHIELD | Attending: Emergency Medicine | Admitting: Emergency Medicine

## 2016-07-29 DIAGNOSIS — R0789 Other chest pain: Secondary | ICD-10-CM | POA: Diagnosis present

## 2016-07-29 DIAGNOSIS — F17290 Nicotine dependence, other tobacco product, uncomplicated: Secondary | ICD-10-CM | POA: Diagnosis not present

## 2016-07-29 DIAGNOSIS — R079 Chest pain, unspecified: Secondary | ICD-10-CM

## 2016-07-29 DIAGNOSIS — R0781 Pleurodynia: Secondary | ICD-10-CM

## 2016-07-29 LAB — I-STAT TROPONIN, ED
Troponin i, poc: 0 ng/mL (ref 0.00–0.08)
Troponin i, poc: 0 ng/mL (ref 0.00–0.08)

## 2016-07-29 LAB — CBC
HEMATOCRIT: 44.6 % (ref 39.0–52.0)
Hemoglobin: 15.2 g/dL (ref 13.0–17.0)
MCH: 29 pg (ref 26.0–34.0)
MCHC: 34.1 g/dL (ref 30.0–36.0)
MCV: 85.1 fL (ref 78.0–100.0)
PLATELETS: 151 10*3/uL (ref 150–400)
RBC: 5.24 MIL/uL (ref 4.22–5.81)
RDW: 14.6 % (ref 11.5–15.5)
WBC: 8.7 10*3/uL (ref 4.0–10.5)

## 2016-07-29 LAB — BASIC METABOLIC PANEL
Anion gap: 7 (ref 5–15)
BUN: 16 mg/dL (ref 6–20)
CHLORIDE: 106 mmol/L (ref 101–111)
CO2: 27 mmol/L (ref 22–32)
CREATININE: 1.03 mg/dL (ref 0.61–1.24)
Calcium: 9.5 mg/dL (ref 8.9–10.3)
GFR calc non Af Amer: >60 mL/min (ref >60)
Glucose, Bld: 93 mg/dL (ref 65–99)
POTASSIUM: 3.9 mmol/L (ref 3.5–5.1)
Sodium: 140 mmol/L (ref 135–145)

## 2016-07-29 LAB — D-DIMER, QUANTITATIVE (NOT AT ARMC)

## 2016-07-29 MED ORDER — KETOROLAC TROMETHAMINE 30 MG/ML IJ SOLN
30.0000 mg | Freq: Once | INTRAMUSCULAR | Status: AC
  Administered 2016-07-29: 30 mg via INTRAVENOUS
  Filled 2016-07-29: qty 1

## 2016-07-29 MED ORDER — IBUPROFEN 600 MG PO TABS: 600.0000 mg | ORAL_TABLET | Freq: Three times a day (TID) | ORAL | 0 refills | Status: AC | PRN

## 2016-07-29 NOTE — ED Triage Notes (Signed)
Patient reports sharp central chest pain with inhalation, SOB, and one episode of chest tightness x4 hours ago. Denies pain radiation and N/V.

## 2016-07-29 NOTE — ED Provider Notes (Addendum)
[facility] DEPT Provider Note   CSN: [id number] Arrival date & time: 07/29/16  1614     History   Chief Complaint Chief Complaint  Patient presents with  . Chest Pain    HPI Samuel Sanford is a 65 y.o. male.  HPI Patient presents with new developing left-sided pleuritic chest pain.  He states is worse when he takes a deep breath.  No unilateral leg swelling.  No history DVT or pulmonary embolism.  Reports mild transient shortness of breath associated with this.  He also had an episode of chest tightness associated with this.  Vorse that his pain was somewhat worse when he laid back as well as when he sat forward.  No radiation of his pain.  Denies nausea vomiting.  No diaphoresis.  Patient works out regularly.  He lifts weights.  He never has chest pain when he is exercising.  He is a retired Development worker, [employer].   Past Medical History:  Diagnosis Date  . Allergic rhinitis   . Anxiety   . Colon polyps   . Diverticulitis   . Exposure to hepatitis C    Never had the infection, worked with inmates for drug rehab    Patient Active Problem List   Diagnosis Date Noted  . Diverticulitis large intestine 05/07/2015    Past Surgical History:  Procedure Laterality Date  . CIRCUMCISION    . COLONOSCOPY    . LAPAROSCOPIC PARTIAL COLECTOMY Left 01/16/2016   Procedure: LAPAROSCOPIC LEFT  COLECTOMY WITH SLENIC FLEXURE MOBILIZATION;  Surgeon: Fanny Skates, MD;  Location: [facility];  Service: General;  Laterality: Left;  . PROCTOSCOPY  01/16/2016   Procedure: PROCTOSCOPY;  Surgeon: Fanny Skates, MD;  Location: [facility];  Service: General;;       Home Medications    Prior to Admission medications   Medication Sig Start Date End Date Taking? Authorizing Provider  CALCIUM CARBONATE PO Take 1 tablet by mouth daily.   Yes Historical Provider, MD  Cholecalciferol (VITAMIN D3 PO) Take 1 tablet by mouth daily.   Yes Historical Provider, MD  Coenzyme Q10 100 MG TABS Take 100 mg by mouth  daily.   Yes Historical Provider, MD  Omega-3 Fatty Acids (FISH OIL) 1200 MG CAPS Take 1,200 mg by mouth daily.    Yes Historical Provider, MD  OVER THE COUNTER MEDICATION Take 1,000 mg by mouth daily. Cinnamon 1000mg    Yes Historical Provider, MD  HYDROcodone-acetaminophen (NORCO/VICODIN) 5-325 MG tablet Take 1-2 tablets by mouth every 4 (four) hours as needed for moderate pain. Patient not taking: Reported on 07/29/2016 01/21/16   Fanny Skates, MD  ibuprofen (ADVIL,MOTRIN) 600 MG tablet Take 1 tablet (600 mg total) by mouth every 8 (eight) hours as needed. 07/29/16   Jola Schmidt, MD  sildenafil (VIAGRA) 100 MG tablet Take 100 mg by mouth daily as needed for erectile dysfunction.    Historical Provider, MD    Family History Family History  Problem Relation Age of Onset  . Heart disease Mother   . Colon cancer Neg Hx     Social History Social History  Substance Use Topics  . Smoking status: Light Tobacco Smoker    Years: 38.00    Types: Cigars    Last attempt to quit: 08/31/1994  . Smokeless tobacco: Former Systems developer    Types: Snuff     Comment: occassionally smokes a cigar at present  . Alcohol use Yes     Comment: occasionlly     Allergies   Patient has  no known allergies.   Review of Systems Review of Systems  All other systems reviewed and are negative.    Physical Exam Updated Vital Signs BP 170/95 (BP Location: Right Arm)   Pulse 83   Resp 16   SpO2 98%   Physical Exam  Constitutional: He is oriented to person, place, and time. He appears well-developed and well-nourished.  HENT:  Head: Normocephalic and atraumatic.  Eyes: EOM are normal.  Neck: Normal range of motion.  Cardiovascular: Normal rate, regular rhythm and normal heart sounds.   Pulmonary/Chest: Effort normal and breath sounds normal. No respiratory distress. He exhibits no tenderness.  Abdominal: Soft. He exhibits no distension. There is no tenderness.  Musculoskeletal: Normal range of motion.    Neurological: He is alert and oriented to person, place, and time.  Skin: Skin is warm and dry.  Psychiatric: He has a normal mood and affect. Judgment normal.  Nursing note and vitals reviewed.    ED Treatments / Results  Labs (all labs ordered are listed, but only abnormal results are displayed) Labs Reviewed  BASIC METABOLIC PANEL  CBC  D-DIMER, QUANTITATIVE (NOT AT [facility])  Randolm Idol, ED  Randolm Idol, ED    EKG  EKG Interpretation  Date/Time:  Wednesday July 29 2016 16:19:26 EST Ventricular Rate:  82 PR Interval:    QRS Duration: 92 QT Interval:  383 QTC Calculation: 448 R Axis:   80 Text Interpretation:  Sinus rhythm ST elevation suggests acute pericarditis No old tracing to compare Confirmed by Pinkie Manger  MD, Lennette Bihari ([id number]) on 07/29/2016 8:53:28 PM       Radiology Dg Chest 2 View  Result Date: 07/29/2016 CLINICAL DATA:  Chest pain EXAM: CHEST  2 VIEW COMPARISON:  None. FINDINGS: There are small calcifications in the left apex, likely granulomas. There is slight scarring in the lateral right base. No edema or consolidation. Heart size and pulmonary vascularity are normal. No adenopathy. There is mild degenerative change at the thoracolumbar junction. No pneumothorax. IMPRESSION: Slight scarring right base. Small granuloma left apex. No edema or consolidation. Electronically Signed   By: Lowella Grip III M.D.   On: 07/29/2016 17:18    Procedures Procedures (including critical care time)  Medications Ordered in ED Medications  ketorolac (TORADOL) 30 MG/ML injection 30 mg (30 mg Intravenous Given 07/29/16 2212)     Initial Impression / Assessment and Plan / ED Course  I have reviewed the triage vital signs and the nursing notes.  Pertinent labs & imaging results that were available during my care of the patient were reviewed by me and considered in my medical decision making (see chart for details).  Clinical Course     Patient feels much  better this time.  Doubt ACS.  D-dimer negative.  Troponin negative 2.  EKG with diffuse ST changes which could represent pericarditis.  His likely pericarditis or pleurisy.  He feels much better after IV Toradol.  Home with anti-inflammatories.   Final Clinical Impressions(s) / ED Diagnoses   Final diagnoses:  Chest pain, unspecified type  Pleuritic chest pain    New Prescriptions New Prescriptions   IBUPROFEN (ADVIL,MOTRIN) 600 MG TABLET    Take 1 tablet (600 mg total) by mouth every 8 (eight) hours as needed.     Jola Schmidt, MD 07/29/16 2308    Jola Schmidt, MD 08/06/16 [phone]

## 2016-08-13 ENCOUNTER — Other Ambulatory Visit: Payer: Self-pay | Admitting: Family Medicine

## 2016-08-13 DIAGNOSIS — R221 Localized swelling, mass and lump, neck: Secondary | ICD-10-CM

## 2016-08-26 ENCOUNTER — Other Ambulatory Visit: Payer: BLUE CROSS/BLUE SHIELD

## 2017-12-13 ENCOUNTER — Encounter: Payer: Self-pay | Admitting: Internal Medicine

## 2017-12-21 IMAGING — CR DG CHEST 2V
2 series · 2 of 2 positions shown · non-contrast
Comparison: None.

CLINICAL DATA: Chest pain

EXAM:
CHEST  2 VIEW

[w chest pa]
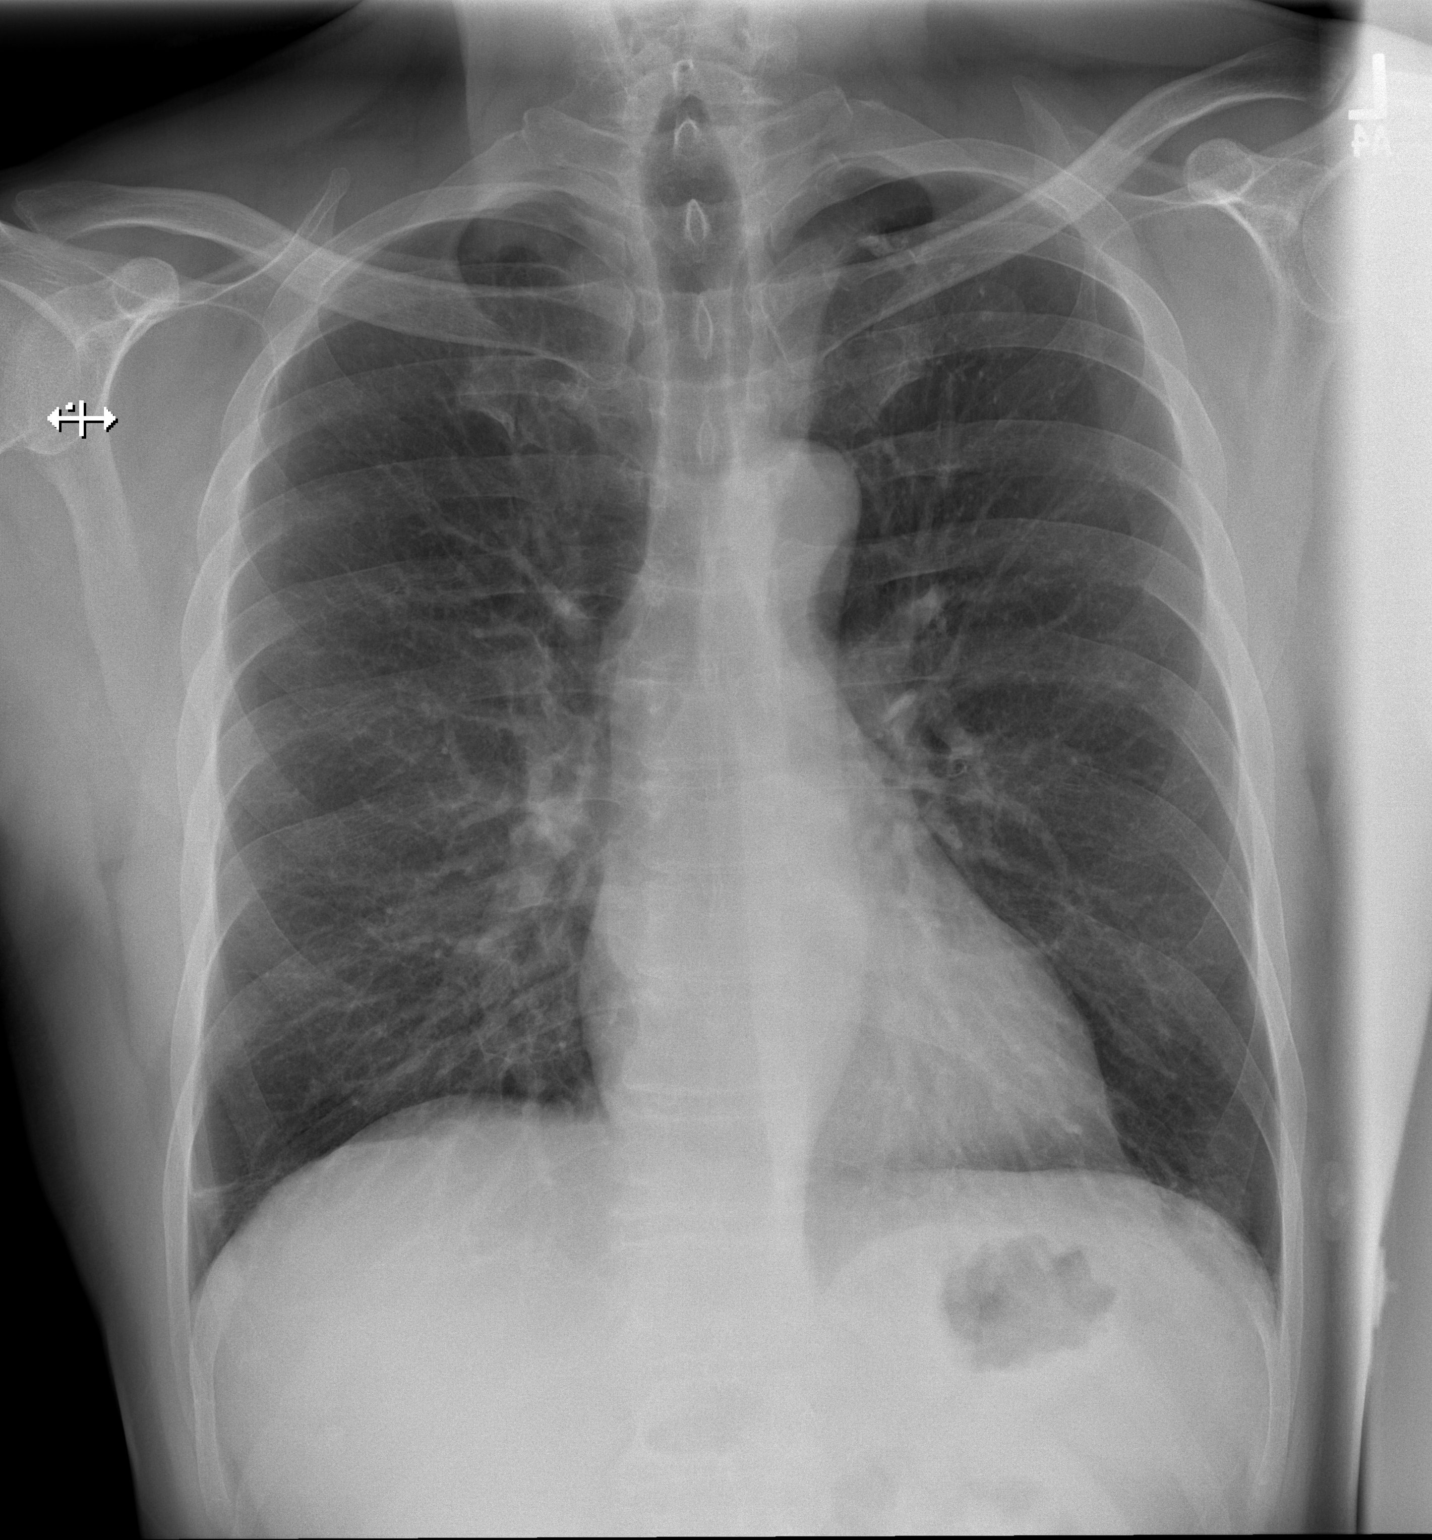

[w chest lat]
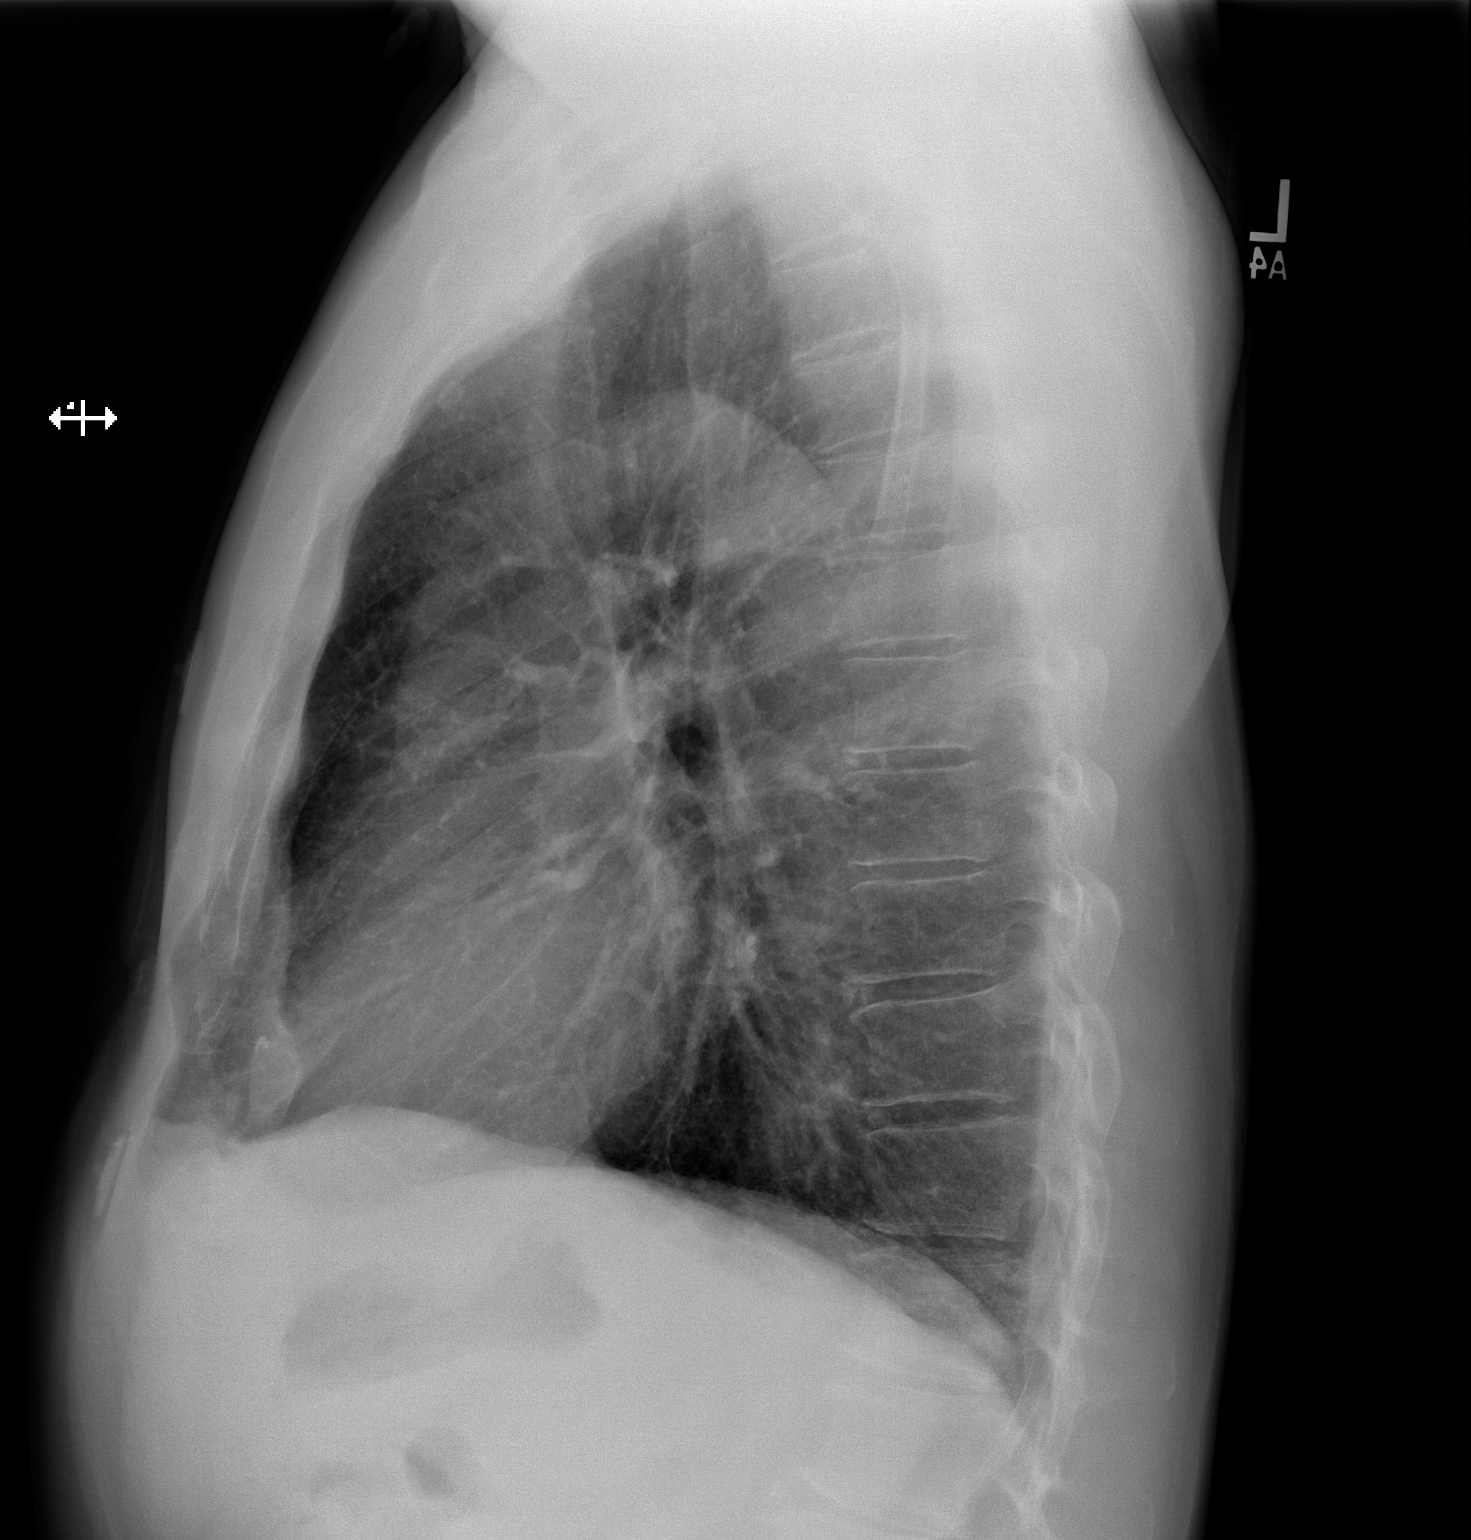

[2 of 2 positions shown; findings below may reference images not displayed]

FINDINGS: There are small calcifications in the left apex, likely granulomas.
There is slight scarring in the lateral right base. No edema or
consolidation. Heart size and pulmonary vascularity are normal. No
adenopathy. There is mild degenerative change at the thoracolumbar
junction. No pneumothorax.
IMPRESSION: Slight scarring right base. Small granuloma left apex. No edema or
consolidation.
